# Patient Record
Sex: Female | Born: 1965 | Race: White | Hispanic: No | Marital: Married | Smoking: Never smoker
Health system: Southern US, Community
[De-identification: ages and names within clinical notes are randomized; demographics above are authoritative.]

---

## 1997-10-30 ENCOUNTER — Ambulatory Visit (HOSPITAL_COMMUNITY): Admission: RE | Admit: 1997-10-30 | Discharge: 1997-10-30 | Payer: Self-pay | Admitting: Obstetrics and Gynecology

## 2004-12-15 ENCOUNTER — Other Ambulatory Visit: Admission: RE | Admit: 2004-12-15 | Discharge: 2004-12-15 | Payer: Self-pay | Admitting: Obstetrics and Gynecology

## 2006-03-02 ENCOUNTER — Encounter: Admission: RE | Admit: 2006-03-02 | Discharge: 2006-03-02 | Payer: Self-pay | Admitting: Obstetrics and Gynecology

## 2010-02-06 ENCOUNTER — Encounter: Payer: Self-pay | Admitting: Obstetrics and Gynecology

## 2011-01-26 ENCOUNTER — Other Ambulatory Visit: Payer: Self-pay | Admitting: Obstetrics and Gynecology

## 2011-01-26 DIAGNOSIS — R928 Other abnormal and inconclusive findings on diagnostic imaging of breast: Secondary | ICD-10-CM

## 2011-02-07 ENCOUNTER — Ambulatory Visit
Admission: RE | Admit: 2011-02-07 | Discharge: 2011-02-07 | Disposition: A | Discharge status: Home / Self Care | Payer: BC Managed Care – PPO | Source: Ambulatory Visit | Attending: Obstetrics and Gynecology | Admitting: Obstetrics and Gynecology

## 2011-02-07 DIAGNOSIS — R928 Other abnormal and inconclusive findings on diagnostic imaging of breast: Secondary | ICD-10-CM

## 2012-10-31 ENCOUNTER — Encounter: Payer: Self-pay | Admitting: Internal Medicine

## 2017-09-03 ENCOUNTER — Other Ambulatory Visit: Payer: Self-pay | Admitting: Family Medicine

## 2017-09-03 DIAGNOSIS — Z6827 Body mass index (BMI) 27.0-27.9, adult: Secondary | ICD-10-CM | POA: Diagnosis not present

## 2017-09-03 DIAGNOSIS — M7711 Lateral epicondylitis, right elbow: Secondary | ICD-10-CM | POA: Diagnosis not present

## 2017-09-03 DIAGNOSIS — Z803 Family history of malignant neoplasm of breast: Secondary | ICD-10-CM | POA: Diagnosis not present

## 2017-09-03 DIAGNOSIS — N6310 Unspecified lump in the right breast, unspecified quadrant: Secondary | ICD-10-CM

## 2017-09-07 ENCOUNTER — Ambulatory Visit
Admission: RE | Admit: 2017-09-07 | Discharge: 2017-09-07 | Disposition: A | Discharge status: Home / Self Care | Payer: BLUE CROSS/BLUE SHIELD | Source: Ambulatory Visit | Attending: Family Medicine | Admitting: Family Medicine

## 2017-09-07 DIAGNOSIS — Z13220 Encounter for screening for lipoid disorders: Secondary | ICD-10-CM | POA: Diagnosis not present

## 2017-09-07 DIAGNOSIS — N6310 Unspecified lump in the right breast, unspecified quadrant: Secondary | ICD-10-CM

## 2017-09-07 DIAGNOSIS — Z1329 Encounter for screening for other suspected endocrine disorder: Secondary | ICD-10-CM | POA: Diagnosis not present

## 2017-09-07 DIAGNOSIS — Z0000 Encounter for general adult medical examination without abnormal findings: Secondary | ICD-10-CM | POA: Diagnosis not present

## 2017-09-07 DIAGNOSIS — Z114 Encounter for screening for human immunodeficiency virus [HIV]: Secondary | ICD-10-CM | POA: Diagnosis not present

## 2017-09-07 DIAGNOSIS — R922 Inconclusive mammogram: Secondary | ICD-10-CM | POA: Diagnosis not present

## 2017-09-07 DIAGNOSIS — N6489 Other specified disorders of breast: Secondary | ICD-10-CM | POA: Diagnosis not present

## 2017-09-12 DIAGNOSIS — Z23 Encounter for immunization: Secondary | ICD-10-CM | POA: Diagnosis not present

## 2017-09-12 DIAGNOSIS — Z6827 Body mass index (BMI) 27.0-27.9, adult: Secondary | ICD-10-CM | POA: Diagnosis not present

## 2017-09-12 DIAGNOSIS — Z0000 Encounter for general adult medical examination without abnormal findings: Secondary | ICD-10-CM | POA: Diagnosis not present

## 2017-09-13 ENCOUNTER — Encounter: Payer: Self-pay | Admitting: Gastroenterology

## 2017-10-12 DIAGNOSIS — Z23 Encounter for immunization: Secondary | ICD-10-CM | POA: Diagnosis not present

## 2017-10-26 ENCOUNTER — Telehealth: Payer: Self-pay

## 2017-10-26 NOTE — Telephone Encounter (Signed)
Patient No Showed for PV today. A message was left on voicemail to call and reschedule her appointment by 5:00 Pm today. Patient was informed that her colonoscopy would be cancelled if she does not reschedule by 5:00 Pm today. A no show letter will also be mailed.   Riki Sheer, LPN ( PV )

## 2017-11-09 ENCOUNTER — Encounter: Payer: BLUE CROSS/BLUE SHIELD | Admitting: Gastroenterology

## 2018-02-27 DIAGNOSIS — Z23 Encounter for immunization: Secondary | ICD-10-CM | POA: Diagnosis not present

## 2018-02-27 DIAGNOSIS — L570 Actinic keratosis: Secondary | ICD-10-CM | POA: Diagnosis not present

## 2018-02-27 DIAGNOSIS — L820 Inflamed seborrheic keratosis: Secondary | ICD-10-CM | POA: Diagnosis not present

## 2018-03-15 DIAGNOSIS — Z23 Encounter for immunization: Secondary | ICD-10-CM | POA: Diagnosis not present

## 2018-08-12 DIAGNOSIS — M25561 Pain in right knee: Secondary | ICD-10-CM | POA: Diagnosis not present

## 2018-09-06 DIAGNOSIS — M25561 Pain in right knee: Secondary | ICD-10-CM | POA: Diagnosis not present

## 2018-09-10 DIAGNOSIS — Z20828 Contact with and (suspected) exposure to other viral communicable diseases: Secondary | ICD-10-CM | POA: Diagnosis not present

## 2018-09-18 DIAGNOSIS — M25561 Pain in right knee: Secondary | ICD-10-CM | POA: Diagnosis not present

## 2018-10-10 DIAGNOSIS — Z23 Encounter for immunization: Secondary | ICD-10-CM | POA: Diagnosis not present

## 2019-04-09 ENCOUNTER — Other Ambulatory Visit: Payer: Self-pay | Admitting: Family Medicine

## 2019-04-09 DIAGNOSIS — Z1231 Encounter for screening mammogram for malignant neoplasm of breast: Secondary | ICD-10-CM

## 2019-05-22 ENCOUNTER — Encounter: Payer: Self-pay | Admitting: Gastroenterology

## 2019-05-23 ENCOUNTER — Other Ambulatory Visit: Payer: Self-pay

## 2019-05-23 ENCOUNTER — Ambulatory Visit
Admission: RE | Admit: 2019-05-23 | Discharge: 2019-05-23 | Disposition: A | Discharge status: Home / Self Care | Payer: 59 | Source: Ambulatory Visit | Attending: Family Medicine | Admitting: Family Medicine

## 2019-05-23 DIAGNOSIS — Z1231 Encounter for screening mammogram for malignant neoplasm of breast: Secondary | ICD-10-CM

## 2019-06-23 IMAGING — US US AXILLARY RIGHT
1 series · 2 of 2 positions shown · non-contrast
Comparison: Previous exam(s).

CLINICAL DATA: Patient presents with fullness the right axilla.

EXAM:
DIGITAL DIAGNOSTIC BILATERAL MAMMOGRAM WITH CAD AND TOMO
ULTRASOUND RIGHT AXILLA

[Series 1: us axillary right · 0.07mm/px · 2 of 2 slices shown]
[im 1/2]
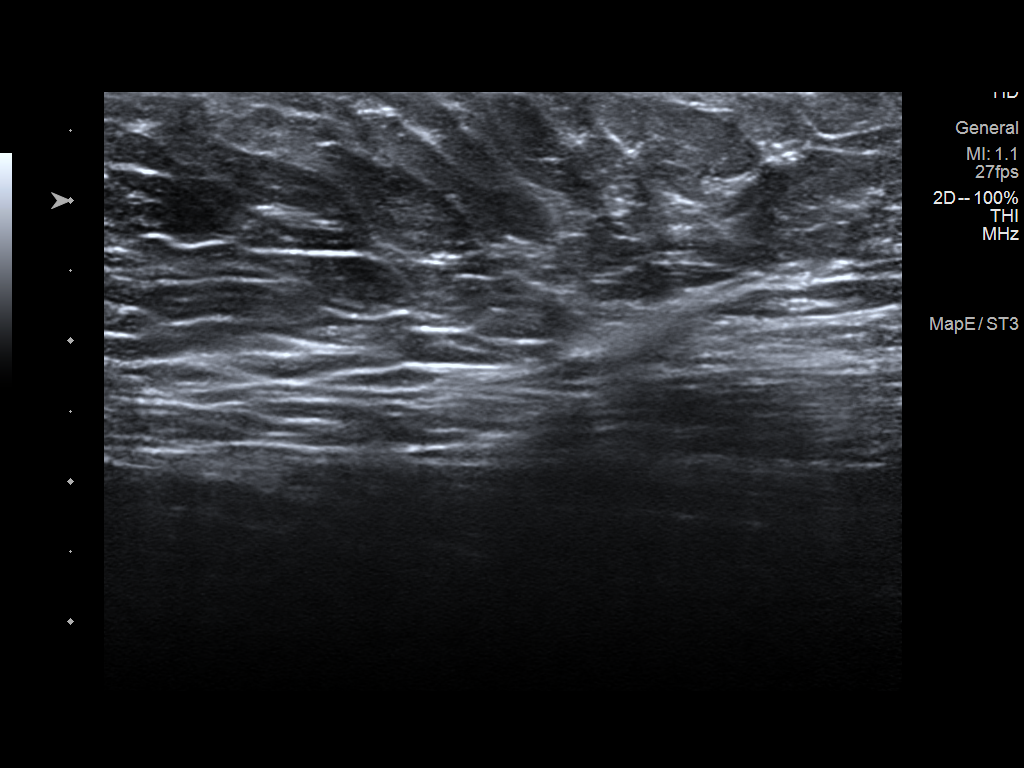
[im 2/2]
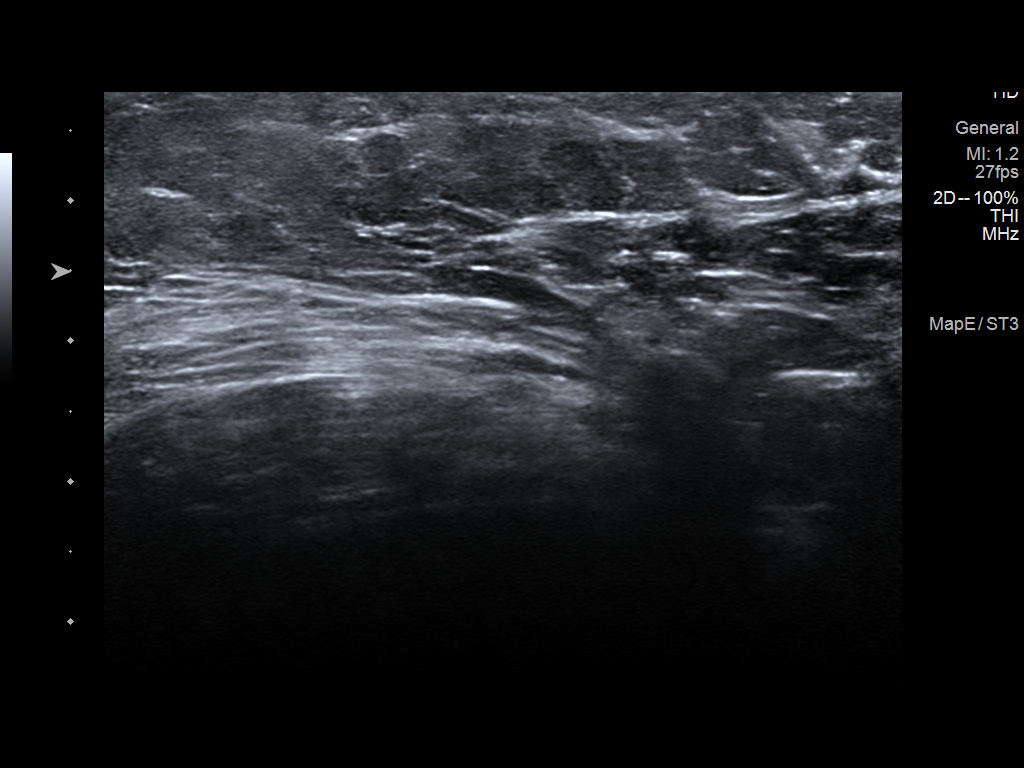

[2 of 2 positions shown; findings below may reference images not displayed]

ACR Breast Density Category c: The breast tissue is heterogeneously
dense, which may obscure small masses.
FINDINGS: There are no discrete masses, areas of architectural distortion
areas of significant asymmetry or suspicious calcifications. No
mammographic change.

Mammographic images were processed with CAD.

On physical exam, there is mild asymmetry the axilla, right
prominent left, but no discrete mass.

Targeted ultrasound is performed, showing normal tissue in the right
axilla. Mass. No enlarged or lymph nodes.
IMPRESSION: 1. Negative exam.  No evidence of breast malignancy.
2. Right axillary fullness appears to be physiologic.

RECOMMENDATION:
Screening mammogram in one year.(Code:TY-G-2YC)

I have discussed the findings and recommendations with the patient.
Results were also provided in writing at the conclusion of the
visit. If applicable, a reminder letter will be sent to the patient
regarding the next appointment.

BI-RADS CATEGORY  1: Negative.

## 2019-06-28 ENCOUNTER — Other Ambulatory Visit: Payer: Self-pay

## 2019-06-28 ENCOUNTER — Encounter (HOSPITAL_COMMUNITY): Payer: Self-pay | Admitting: Anesthesiology

## 2019-06-28 ENCOUNTER — Encounter (HOSPITAL_COMMUNITY)
Admission: EM | Disposition: A | Discharge status: Home / Self Care | Payer: Self-pay | Source: Home / Self Care | Attending: Emergency Medicine

## 2019-06-28 ENCOUNTER — Emergency Department (HOSPITAL_COMMUNITY): Payer: No Typology Code available for payment source | Admitting: Anesthesiology

## 2019-06-28 ENCOUNTER — Emergency Department (HOSPITAL_COMMUNITY): Payer: No Typology Code available for payment source

## 2019-06-28 ENCOUNTER — Ambulatory Visit (HOSPITAL_COMMUNITY)
Admission: EM | Admit: 2019-06-28 | Discharge: 2019-06-29 | Disposition: A | Discharge status: Home / Self Care | Payer: No Typology Code available for payment source | Attending: Emergency Medicine | Admitting: Emergency Medicine

## 2019-06-28 DIAGNOSIS — K353 Acute appendicitis with localized peritonitis, without perforation or gangrene: Secondary | ICD-10-CM | POA: Insufficient documentation

## 2019-06-28 DIAGNOSIS — R1031 Right lower quadrant pain: Secondary | ICD-10-CM | POA: Diagnosis present

## 2019-06-28 DIAGNOSIS — K429 Umbilical hernia without obstruction or gangrene: Secondary | ICD-10-CM | POA: Insufficient documentation

## 2019-06-28 DIAGNOSIS — Z20822 Contact with and (suspected) exposure to covid-19: Secondary | ICD-10-CM | POA: Insufficient documentation

## 2019-06-28 HISTORY — PX: UMBILICAL HERNIA REPAIR: SHX196

## 2019-06-28 HISTORY — PX: LAPAROSCOPIC APPENDECTOMY: SHX408

## 2019-06-28 LAB — COMPREHENSIVE METABOLIC PANEL
ALT: 19 U/L (ref 0–44)
AST: 16 U/L (ref 15–41)
Albumin: 4.1 g/dL (ref 3.5–5.0)
Alkaline Phosphatase: 81 U/L (ref 38–126)
Anion gap: 10 (ref 5–15)
BUN: 11 mg/dL (ref 6–20)
CO2: 26 mmol/L (ref 22–32)
Calcium: 9.5 mg/dL (ref 8.9–10.3)
Chloride: 104 mmol/L (ref 98–111)
Creatinine, Ser: 0.86 mg/dL (ref 0.44–1.00)
GFR calc Af Amer: >60 mL/min (ref >60)
GFR calc non Af Amer: >60 mL/min (ref >60)
Glucose, Bld: 102 mg/dL — ABNORMAL HIGH (ref 70–99)
Potassium: 4 mmol/L (ref 3.5–5.1)
Sodium: 140 mmol/L (ref 135–145)
Total Bilirubin: 1.8 mg/dL — ABNORMAL HIGH (ref 0.3–1.2)
Total Protein: 7.5 g/dL (ref 6.5–8.1)

## 2019-06-28 LAB — URINALYSIS, ROUTINE W REFLEX MICROSCOPIC
Bacteria, UA: NONE SEEN
Bilirubin Urine: NEGATIVE
Glucose, UA: NEGATIVE mg/dL
Ketones, ur: NEGATIVE mg/dL
Leukocytes,Ua: NEGATIVE
Nitrite: NEGATIVE
Protein, ur: NEGATIVE mg/dL
Specific Gravity, Urine: 1.01 (ref 1.005–1.030)
pH: 5 (ref 5.0–8.0)

## 2019-06-28 LAB — CBC
HCT: 39.7 % (ref 36.0–46.0)
Hemoglobin: 12.9 g/dL (ref 12.0–15.0)
MCH: 30.9 pg (ref 26.0–34.0)
MCHC: 32.5 g/dL (ref 30.0–36.0)
MCV: 95 fL (ref 80.0–100.0)
Platelets: 191 10*3/uL (ref 150–400)
RBC: 4.18 MIL/uL (ref 3.87–5.11)
RDW: 14 % (ref 11.5–15.5)
WBC: 14.4 10*3/uL — ABNORMAL HIGH (ref 4.0–10.5)
nRBC: 0 % (ref 0.0–0.2)

## 2019-06-28 LAB — PREGNANCY, URINE: Preg Test, Ur: NEGATIVE

## 2019-06-28 LAB — LIPASE, BLOOD: Lipase: 23 U/L (ref 11–51)

## 2019-06-28 LAB — I-STAT BETA HCG BLOOD, ED (MC, WL, AP ONLY): I-stat hCG, quantitative: 15.3 m[IU]/mL — ABNORMAL HIGH (ref <5)

## 2019-06-28 LAB — SARS CORONAVIRUS 2 BY RT PCR (HOSPITAL ORDER, PERFORMED IN ~~LOC~~ HOSPITAL LAB): SARS Coronavirus 2: NEGATIVE

## 2019-06-28 SURGERY — APPENDECTOMY, LAPAROSCOPIC
Anesthesia: General | Site: Abdomen

## 2019-06-28 MED ORDER — SODIUM CHLORIDE 0.9 % IV BOLUS
1000.0000 mL | Freq: Once | INTRAVENOUS | Status: AC
Start: 2019-06-28 — End: 2019-06-28
  Administered 2019-06-28: 1000 mL via INTRAVENOUS

## 2019-06-28 MED ORDER — KETOROLAC TROMETHAMINE 30 MG/ML IJ SOLN
INTRAMUSCULAR | Status: DC | PRN
  Administered 2019-06-28: 15 mg via INTRAVENOUS

## 2019-06-28 MED ORDER — DEXAMETHASONE SODIUM PHOSPHATE 10 MG/ML IJ SOLN
INTRAMUSCULAR | Status: DC | PRN
  Administered 2019-06-28: 10 mg via INTRAVENOUS

## 2019-06-28 MED ORDER — FENTANYL CITRATE (PF) 100 MCG/2ML IJ SOLN
INTRAMUSCULAR | Status: DC | PRN
  Administered 2019-06-28: 25 ug via INTRAVENOUS
  Administered 2019-06-28 (×2): 50 ug via INTRAVENOUS

## 2019-06-28 MED ORDER — PROPOFOL 10 MG/ML IV BOLUS
INTRAVENOUS | Status: DC | PRN
  Administered 2019-06-28: 170 mg via INTRAVENOUS

## 2019-06-28 MED ORDER — SUCCINYLCHOLINE CHLORIDE 20 MG/ML IJ SOLN
INTRAMUSCULAR | Status: DC | PRN
  Administered 2019-06-28: 140 mg via INTRAVENOUS

## 2019-06-28 MED ORDER — SUGAMMADEX SODIUM 200 MG/2ML IV SOLN
INTRAVENOUS | Status: DC | PRN
  Administered 2019-06-28: 200 mg via INTRAVENOUS

## 2019-06-28 MED ORDER — ONDANSETRON HCL 4 MG/2ML IJ SOLN
4.0000 mg | Freq: Once | INTRAMUSCULAR | Status: AC
  Administered 2019-06-28: 4 mg via INTRAVENOUS
  Filled 2019-06-28: qty 2

## 2019-06-28 MED ORDER — IOHEXOL 300 MG/ML  SOLN
100.0000 mL | Freq: Once | INTRAMUSCULAR | Status: AC | PRN
  Administered 2019-06-28: 100 mL via INTRAVENOUS

## 2019-06-28 MED ORDER — BUPIVACAINE HCL (PF) 0.25 % IJ SOLN
INTRAMUSCULAR | Status: AC
  Filled 2019-06-28: qty 30

## 2019-06-28 MED ORDER — ONDANSETRON HCL 4 MG/2ML IJ SOLN
INTRAMUSCULAR | Status: DC | PRN
  Administered 2019-06-28: 4 mg via INTRAVENOUS

## 2019-06-28 MED ORDER — SODIUM CHLORIDE 0.9% FLUSH: 3.0000 mL | Freq: Once | INTRAVENOUS | Status: DC

## 2019-06-28 MED ORDER — MIDAZOLAM HCL 2 MG/2ML IJ SOLN
INTRAMUSCULAR | Status: AC
  Filled 2019-06-28: qty 2

## 2019-06-28 MED ORDER — MIDAZOLAM HCL 5 MG/5ML IJ SOLN
INTRAMUSCULAR | Status: DC | PRN
  Administered 2019-06-28 (×2): 1 mg via INTRAVENOUS

## 2019-06-28 MED ORDER — LACTATED RINGERS IV SOLN: INTRAVENOUS | Status: DC | PRN

## 2019-06-28 MED ORDER — SODIUM CHLORIDE 0.9 % IV SOLN
2.0000 g | Freq: Once | INTRAVENOUS | Status: AC
  Administered 2019-06-28: 2 g via INTRAVENOUS
  Filled 2019-06-28: qty 20

## 2019-06-28 MED ORDER — ROCURONIUM BROMIDE 10 MG/ML (PF) SYRINGE
PREFILLED_SYRINGE | INTRAVENOUS | Status: DC | PRN
  Administered 2019-06-28: 30 mg via INTRAVENOUS
  Administered 2019-06-28: 10 mg via INTRAVENOUS

## 2019-06-28 MED ORDER — METRONIDAZOLE IN NACL 5-0.79 MG/ML-% IV SOLN
500.0000 mg | Freq: Once | INTRAVENOUS | Status: AC
  Administered 2019-06-28: 500 mg via INTRAVENOUS
  Filled 2019-06-28: qty 100

## 2019-06-28 MED ORDER — FENTANYL CITRATE (PF) 250 MCG/5ML IJ SOLN
INTRAMUSCULAR | Status: AC
  Filled 2019-06-28: qty 5

## 2019-06-28 MED ORDER — LIDOCAINE 2% (20 MG/ML) 5 ML SYRINGE
INTRAMUSCULAR | Status: DC | PRN
  Administered 2019-06-28: 40 mg via INTRAVENOUS

## 2019-06-28 SURGICAL SUPPLY — 48 items
APPLIER CLIP ROT 10 11.4 M/L (STAPLE)
CANISTER SUCT 3000ML PPV (MISCELLANEOUS) ×3 IMPLANT
CHLORAPREP W/TINT 26 (MISCELLANEOUS) ×3 IMPLANT
CLIP APPLIE ROT 10 11.4 M/L (STAPLE) IMPLANT
CLOSURE STERI-STRIP 1/2X4 (GAUZE/BANDAGES/DRESSINGS) ×1
CLOSURE WOUND 1/2 X4 (GAUZE/BANDAGES/DRESSINGS) ×1
CLSR STERI-STRIP ANTIMIC 1/2X4 (GAUZE/BANDAGES/DRESSINGS) ×2 IMPLANT
COVER SURGICAL LIGHT HANDLE (MISCELLANEOUS) ×3 IMPLANT
COVER WAND RF STERILE (DRAPES) ×3 IMPLANT
CUTTER FLEX LINEAR 45M (STAPLE) ×3 IMPLANT
DERMABOND ADVANCED (GAUZE/BANDAGES/DRESSINGS) ×2
DERMABOND ADVANCED .7 DNX12 (GAUZE/BANDAGES/DRESSINGS) ×1 IMPLANT
ELECT REM PT RETURN 9FT ADLT (ELECTROSURGICAL) ×3
ELECTRODE REM PT RTRN 9FT ADLT (ELECTROSURGICAL) ×1 IMPLANT
GLOVE BIO SURGEON STRL SZ7 (GLOVE) ×3 IMPLANT
GLOVE BIOGEL PI IND STRL 7.5 (GLOVE) ×1 IMPLANT
GLOVE BIOGEL PI INDICATOR 7.5 (GLOVE) ×2
GLOVE INDICATOR 7.0 STRL GRN (GLOVE) ×3 IMPLANT
GLOVE SURG SS PI 7.0 STRL IVOR (GLOVE) ×3 IMPLANT
GOWN STRL REUS W/ TWL LRG LVL3 (GOWN DISPOSABLE) ×3 IMPLANT
GOWN STRL REUS W/TWL LRG LVL3 (GOWN DISPOSABLE) ×9
GRASPER SUT TROCAR 14GX15 (MISCELLANEOUS) ×3 IMPLANT
KIT BASIN OR (CUSTOM PROCEDURE TRAY) ×3 IMPLANT
KIT TURNOVER KIT B (KITS) ×3 IMPLANT
NS IRRIG 1000ML POUR BTL (IV SOLUTION) ×3 IMPLANT
PAD ARMBOARD 7.5X6 YLW CONV (MISCELLANEOUS) ×6 IMPLANT
PENCIL BUTTON HOLSTER BLD 10FT (ELECTRODE) ×3 IMPLANT
POUCH RETRIEVAL ECOSAC 10 (ENDOMECHANICALS) ×1 IMPLANT
POUCH RETRIEVAL ECOSAC 10MM (ENDOMECHANICALS) ×3
RELOAD 45 VASCULAR/THIN (ENDOMECHANICALS) IMPLANT
RELOAD STAPLE TA45 3.5 REG BLU (ENDOMECHANICALS) ×3 IMPLANT
SCISSORS LAP 5X35 DISP (ENDOMECHANICALS) IMPLANT
SET IRRIG TUBING LAPAROSCOPIC (IRRIGATION / IRRIGATOR) ×3 IMPLANT
SET TUBE SMOKE EVAC HIGH FLOW (TUBING) ×3 IMPLANT
SHEARS HARMONIC ACE PLUS 36CM (ENDOMECHANICALS) ×3 IMPLANT
SLEEVE ENDOPATH XCEL 5M (ENDOMECHANICALS) ×3 IMPLANT
SPECIMEN JAR SMALL (MISCELLANEOUS) ×3 IMPLANT
STRIP CLOSURE SKIN 1/2X4 (GAUZE/BANDAGES/DRESSINGS) ×2 IMPLANT
SUT MNCRL AB 4-0 PS2 18 (SUTURE) ×3 IMPLANT
SUT NOVA NAB DX-16 0-1 5-0 T12 (SUTURE) ×3 IMPLANT
SUT VICRYL 0 UR6 27IN ABS (SUTURE) ×3 IMPLANT
TOWEL GREEN STERILE (TOWEL DISPOSABLE) ×3 IMPLANT
TOWEL GREEN STERILE FF (TOWEL DISPOSABLE) ×3 IMPLANT
TRAY FOLEY MTR SLVR 16FR STAT (SET/KITS/TRAYS/PACK) IMPLANT
TRAY LAPAROSCOPIC MC (CUSTOM PROCEDURE TRAY) ×3 IMPLANT
TROCAR XCEL BLUNT TIP 100MML (ENDOMECHANICALS) ×3 IMPLANT
TROCAR XCEL NON-BLD 5MMX100MML (ENDOMECHANICALS) ×3 IMPLANT
WATER STERILE IRR 1000ML POUR (IV SOLUTION) ×3 IMPLANT

## 2019-06-28 NOTE — ED Provider Notes (Signed)
[facility] DEPART[facility] Provider Note   CSN: [id number] Arrival date & time: 06/28/19  1432     History Chief Complaint  Patient presents with  . Abdominal Pain    Wanda [name] is a 54 y.o. female presents for evaluation of acute onset, progressively worsening right lower quadrant abdominal pain since last night.  She reports that symptoms began after eating "bad [place] food".  She reports that her husband felt a little unwell after eating the [place] food but has not had any symptoms similar to her otherwise and her daughter ate the same food and has had no symptoms at all.  She reports severe sharp right lower quadrant abdominal pain that was initially more generalized.  The pain worsens with certain movements, standing upright.  She has had nausea and one episode of nonbloody nonbilious emesis.  Feels as though her abdomen is distended.  She had a bowel movement earlier today that was a little bit constipated but no diarrhea, melena, hematochezia, urinary symptoms, vaginal itching, bleeding, or discharge out of the ordinary.  No fevers or chills.  She has tried naproxen, Gas-X, Tums, and Imodium without relief of symptoms.  She has had a C-section but otherwise no prior abdominal surgeries.  She is 2 years postmenopausal.  The history is provided by the patient.       History reviewed. No pertinent past medical history.  There are no problems to display for this patient.   History reviewed. No pertinent surgical history.   OB History   No obstetric history on file.     Family History  Problem Relation Age of Onset  . Breast cancer Sister     Social History   Tobacco Use  . Smoking status: Not on file  Substance Use Topics  . Alcohol use: Not on file  . Drug use: Not on file    Home Medications Prior to Admission medications   Not on File    Allergies    Patient has no known allergies.  Review of Systems   Review of Systems   Constitutional: Negative for chills and fever.  Respiratory: Negative for shortness of breath.   Cardiovascular: Negative for chest pain.  Gastrointestinal: Positive for abdominal pain, constipation, nausea and vomiting. Negative for diarrhea.  Genitourinary: Negative for dysuria, frequency, hematuria, urgency, vaginal bleeding, vaginal discharge and vaginal pain.  All other systems reviewed and are negative.   Physical Exam Updated Vital Signs BP 105/74 (BP Location: Right Arm)   Pulse 89   Temp 98.7 F (37.1 C) (Oral)   Resp 18   Ht 5\' 6"  (1.676 m)   Wt 72.6 kg   SpO2 97%   BMI 25.82 kg/m   Physical Exam Vitals and nursing note reviewed.  Constitutional:      General: She is not in acute distress.    Appearance: She is well-developed.  HENT:     Head: Normocephalic and atraumatic.  Eyes:     General:        Right eye: No discharge.        Left eye: No discharge.     Conjunctiva/sclera: Conjunctivae normal.  Neck:     Vascular: No JVD.     Trachea: No tracheal deviation.  Cardiovascular:     Rate and Rhythm: Normal rate.  Pulmonary:     Effort: Pulmonary effort is normal.  Abdominal:     General: Abdomen is flat. Bowel sounds are normal. There is no distension.  Palpations: Abdomen is soft.     Tenderness: There is abdominal tenderness in the right lower quadrant and suprapubic area. There is guarding. There is no right CVA tenderness or left CVA tenderness. Positive signs include McBurney's sign and psoas sign. Negative signs include Murphy's sign, Rovsing's sign and obturator sign.  Skin:    General: Skin is warm.     Findings: No erythema.  Neurological:     Mental Status: She is alert.  Psychiatric:        Behavior: Behavior normal.     ED Results / Procedures / Treatments   Labs (all labs ordered are listed, but only abnormal results are displayed) Labs Reviewed  COMPREHENSIVE METABOLIC PANEL - Abnormal; Notable for the following components:       Result Value   Glucose, Bld 102 (*)    Total Bilirubin 1.8 (*)    All other components within normal limits  CBC - Abnormal; Notable for the following components:   WBC 14.4 (*)    All other components within normal limits  URINALYSIS, ROUTINE W REFLEX MICROSCOPIC - Abnormal; Notable for the following components:   Hgb urine dipstick SMALL (*)    All other components within normal limits  I-STAT BETA HCG BLOOD, ED ([facility], [facility], [facility] ONLY) - Abnormal; Notable for the following components:   I-stat hCG, quantitative 15.3 (*)    All other components within normal limits  SARS CORONAVIRUS 2 BY RT PCR (HOSPITAL ORDER, [facility])  LIPASE, BLOOD  PREGNANCY, URINE    EKG None  Radiology CT ABDOMEN PELVIS W CONTRAST  Result Date: 06/28/2019 CLINICAL DATA:  Right lower quadrant abdominal pain. EXAM: CT ABDOMEN AND PELVIS WITH CONTRAST TECHNIQUE: Multidetector CT imaging of the abdomen and pelvis was performed using the standard protocol following bolus administration of intravenous contrast. CONTRAST:  113mL OMNIPAQUE IOHEXOL 300 MG/ML  SOLN COMPARISON:  None. FINDINGS: Lower chest: The lung bases are clear. The heart size is normal. Hepatobiliary: The liver is normal. Normal gallbladder.There is no biliary ductal dilation. Pancreas: Normal contours without ductal dilatation. No peripancreatic fluid collection. Spleen: Unremarkable. Adrenals/Urinary Tract: --Adrenal glands: Unremarkable. --Right kidney/ureter: A punctate nonobstructing stone is noted in the lower pole. --Left kidney/ureter: There is a punctate nonobstructing stone in the interpolar region of the left kidney. 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 --Urinary bladder: The urinary bladder is significantly distended. Stomach/Bowel: --Stomach/Duodenum: No hiatal hernia or other gastric abnormality. Normal duodenal course and caliber. --Small bowel: Unremarkable. --Colon: Unremarkable. --Appendix: The  appendix is dilated with significant periappendiceal inflammatory changes. The appendix measures up to approximately 1.3 cm in diameter. There is a small amount of reactive free fluid in the patient's pelvis and right lower quadrant. There is no extraluminal free air. There is no well-formed fluid collection concerning for an abscess. Vascular/Lymphatic: Normal course and caliber of the major abdominal vessels. --No retroperitoneal lymphadenopathy. --No mesenteric lymphadenopathy. --No pelvic or inguinal lymphadenopathy. Reproductive: Unremarkable Other: There is a small amount of reactive free fluid in the patient's abdomen and pelvis. There is a fat containing umbilical hernia. Musculoskeletal. No acute displaced fractures. IMPRESSION: 1. Acute appendicitis without evidence for perforation or abscess formation. 2. Distended urinary bladder. 3. Fat containing umbilical hernia. 4. Bilateral nonobstructive nephrolithiasis. Electronically Signed   By: Constance Holster M.D.   On: 06/28/2019 20:19    Procedures Procedures (including critical care time)  Medications Ordered in ED Medications  sodium chloride flush (NS) 0.9 % injection 3  mL (has no administration in time range)  sodium chloride 0.9 % bolus 1,000 mL (0 mLs Intravenous Stopped 06/28/19 2038)  ondansetron (ZOFRAN) injection 4 mg (4 mg Intravenous Given 06/28/19 1920)  iohexol (OMNIPAQUE) 300 MG/ML solution 100 mL (100 mLs Intravenous Contrast Given 06/28/19 2002)  cefTRIAXone (ROCEPHIN) 2 g in sodium chloride 0.9 % 100 mL IVPB (0 g Intravenous Stopped 06/28/19 2128)    And  metroNIDAZOLE (FLAGYL) IVPB 500 mg (0 mg Intravenous Stopped 06/28/19 2142)    ED Course  I have reviewed the triage vital signs and the nursing notes.  Pertinent labs & imaging results that were available during my care of the patient were reviewed by me and considered in my medical decision making (see chart for details).    MDM Rules/Calculators/A&P                           Patient presenting for evaluation of progressively worsening right lower quadrant abdominal pain with associated nausea and vomiting.  She is afebrile, initially tachycardic and mildly hypotensive but had improvement in vital signs on reevaluation and with administration of IV fluids.  She exhibits right lower quadrant abdominal pain with guarding and localized peritoneal signs.  Point-of-care hCG mildly elevated though she is 2 years postmenopausal and I suspect this is a false positive.  She has no GU complaints at this time and I doubt ectopic pregnancy, TOA or ovarian torsion.  Urine pregnancy is negative.  Remainder of lab work reviewed and interpreted by myself shows a leukocytosis of 14.4, no anemia, no metabolic derangements, no renal insufficiency.  CT scan confirms acute appendicitis without evidence of abscess or perforation.  Will start on IV Rocephin and Flagyl.  Patient has refused pain medicine while in the ED which she received IV fluids and Zofran with some improvement in her symptoms.  CONSULT: Spoke with Dr. Donne Hazel with general surgery, plan for laparoscopic appendectomy after Covid test results.   Final Clinical Impression(s) / ED Diagnoses Final diagnoses:  Acute appendicitis with localized peritonitis, without perforation, abscess, or gangrene    Rx / DC Orders ED Discharge Orders    None       Renita Papa, PA-C 06/28/19 2340    Maudie Flakes, MD 06/29/19 2326

## 2019-06-28 NOTE — Anesthesia Preprocedure Evaluation (Addendum)
Anesthesia Evaluation  Patient identified by MRN, date of birth, ID band Patient awake    Reviewed: Allergy & Precautions, NPO status , Patient's Chart, lab work & pertinent test results  Airway Mallampati: I  TM Distance: >3 FB Neck ROM: Full    Dental  (+) Teeth Intact, Dental Advisory Given Multiple caps and crowns. Dental advisory given. Temporary crown bottom back left molar:   Pulmonary    breath sounds clear to auscultation       Cardiovascular  Rhythm:Regular Rate:Normal     Neuro/Psych    GI/Hepatic   Endo/Other    Renal/GU      Musculoskeletal   Abdominal   Peds  Hematology   Anesthesia Other Findings Patient denies any chance of pregnancy  Reproductive/Obstetrics                         Anesthesia Physical Anesthesia Plan  ASA: II and emergent  Anesthesia Plan: General   Post-op Pain Management:    Induction: Intravenous, Cricoid pressure planned and Rapid sequence  PONV Risk Score and Plan: Ondansetron and Dexamethasone  Airway Management Planned: Oral ETT  Additional Equipment:   Intra-op Plan:   Post-operative Plan: Extubation in OR  Informed Consent: I have reviewed the patients History and Physical, chart, labs and discussed the procedure including the risks, benefits and alternatives for the proposed anesthesia with the patient or authorized representative who has indicated his/her understanding and acceptance.     Dental advisory given  Plan Discussed with: CRNA and Anesthesiologist  Anesthesia Plan Comments:         Anesthesia Quick Evaluation

## 2019-06-28 NOTE — Anesthesia Procedure Notes (Signed)
Procedure Name: Intubation Date/Time: 06/28/2019 10:45 PM Performed by: Suzy Bouchard, CRNA Pre-anesthesia Checklist: Patient identified, Emergency Drugs available, Suction available, Patient being monitored and Timeout performed Patient Re-evaluated:Patient Re-evaluated prior to induction Oxygen Delivery Method: Circle system utilized Preoxygenation: Pre-oxygenation with 100% oxygen Induction Type: IV induction and Rapid sequence Laryngoscope Size: Miller and 2 Grade View: Grade I Tube type: Oral Tube size: 7.0 mm Number of attempts: 1 Airway Equipment and Method: Stylet Secured at: 22 cm Tube secured with: Tape Dental Injury: Teeth and Oropharynx as per pre-operative assessment

## 2019-06-28 NOTE — ED Triage Notes (Signed)
Patient complains of generalized abdominal pain now more located rlq. Reports 1 episode of emesis. States that she thinks related to eating chinese food. Pain worse with ambulation and bending. Denies urinary symptoms. Alert and oriented

## 2019-06-28 NOTE — H&P (Signed)
Wanda [name] is an 54 y.o. female.   Chief Complaint: ab pain HPI: 45 yof who ate chinese food last night and thought had food poisoning as she developed rlq ab pain that has been worsening. Nothing making it better. No urinary symptoms. No fevers. Some nausea and one episode of emesis.  Only abd surgery is prior c section. She was seen in er and noted to have elevated wbc.  She has ct c/w appendicitis. I was asked to see her.  No history colonoscopy. Has received covid vaccine in march.   No past medical history on file.  No past surgical history on file.  Family History  Problem Relation Age of Onset  . Breast cancer Sister    Social History:  has no history on file for tobacco use, alcohol use, and drug use.  Allergies: No Known Allergies  meds none  Results for orders placed or performed during the hospital encounter of 06/28/19 (from the past 48 hour(s))  Lipase, blood     Status: None   Collection Time: 06/28/19  3:05 PM  Result Value Ref Range   Lipase 23 11 - 51 U/L    Comment: Performed at [facility], [place] [address], [place], [place] [address]  Comprehensive metabolic panel     Status: Abnormal   Collection Time: 06/28/19  3:05 PM  Result Value Ref Range   Sodium 140 135 - 145 mmol/L   Potassium 4.0 3.5 - 5.1 mmol/L   Chloride 104 98 - 111 mmol/L   CO2 26 22 - 32 mmol/L   Glucose, Bld 102 (H) 70 - 99 mg/dL    Comment: Glucose reference range applies only to samples taken after fasting for at least 8 hours.   BUN 11 6 - 20 mg/dL   Creatinine, Ser 0.86 0.44 - 1.00 mg/dL   Calcium 9.5 8.9 - 10.3 mg/dL   Total Protein 7.5 6.5 - 8.1 g/dL   Albumin 4.1 3.5 - 5.0 g/dL   AST 16 15 - 41 U/L   ALT 19 0 - 44 U/L   Alkaline Phosphatase 81 38 - 126 U/L   Total Bilirubin 1.8 (H) 0.3 - 1.2 mg/dL   GFR calc non Af Amer >60 >60 mL/min   GFR calc Af Amer >60 >60 mL/min   Anion gap 10 5 - 15    Comment: Performed at [facility], [place] [address] [place],  [place] [address]  CBC     Status: Abnormal   Collection Time: 06/28/19  3:05 PM  Result Value Ref Range   WBC 14.4 (H) 4.0 - 10.5 K/uL   RBC 4.18 3.87 - 5.11 MIL/uL   Hemoglobin 12.9 12.0 - 15.0 g/dL   HCT 39.7 36 - 46 %   MCV 95.0 80.0 - 100.0 fL   MCH 30.9 26.0 - 34.0 pg   MCHC 32.5 30.0 - 36.0 g/dL   RDW 14.0 11.5 - 15.5 %   Platelets 191 150 - 400 K/uL   nRBC 0.0 0.0 - 0.2 %    Comment: Performed at [facility], [place] [address] [place], [place] [address]  Urinalysis, Routine w reflex microscopic     Status: Abnormal   Collection Time: 06/28/19  3:23 PM  Result Value Ref Range   Color, Urine YELLOW YELLOW   APPearance CLEAR CLEAR   Specific Gravity, Urine 1.010 1.005 - 1.030   pH 5.0 5.0 - 8.0   Glucose, UA NEGATIVE NEGATIVE mg/dL  Hgb urine dipstick SMALL (A) NEGATIVE   Bilirubin Urine NEGATIVE NEGATIVE   Ketones, ur NEGATIVE NEGATIVE mg/dL   Protein, ur NEGATIVE NEGATIVE mg/dL   Nitrite NEGATIVE NEGATIVE   Leukocytes,Ua NEGATIVE NEGATIVE   RBC / HPF 0-5 0 - 5 RBC/hpf   WBC, UA 0-5 0 - 5 WBC/hpf   Bacteria, UA NONE SEEN NONE SEEN   Squamous Epithelial / LPF 0-5 0 - 5   Mucus PRESENT     Comment: Performed at [facility], [place] [address], [place], [place] [address]  I-Stat beta hCG blood, ED     Status: Abnormal   Collection Time: 06/28/19  3:45 PM  Result Value Ref Range   I-stat hCG, quantitative 15.3 (H) <5 mIU/mL   Comment 3            Comment:   GEST. AGE      CONC.  (mIU/mL)   <=1 WEEK        5 - 50     2 WEEKS       50 - 500     3 WEEKS       100 - 10,000     4 WEEKS     1,000 - 30,000        FEMALE AND NON-PREGNANT FEMALE:     LESS THAN 5 mIU/mL   Pregnancy, urine     Status: None   Collection Time: 06/28/19  6:09 PM  Result Value Ref Range   Preg Test, Ur NEGATIVE NEGATIVE    Comment:        THE SENSITIVITY OF THIS METHODOLOGY IS >20 mIU/mL. Performed at [facility], [place] [address], [place],  [address]    CT ABDOMEN PELVIS W  CONTRAST  Result Date: 06/28/2019 CLINICAL DATA:  Right lower quadrant abdominal pain. EXAM: CT ABDOMEN AND PELVIS WITH CONTRAST TECHNIQUE: Multidetector CT imaging of the abdomen and pelvis was performed using the standard protocol following bolus administration of intravenous contrast. CONTRAST:  161mL OMNIPAQUE IOHEXOL 300 MG/ML  SOLN COMPARISON:  None. FINDINGS: Lower chest: The lung bases are clear. The heart size is normal. Hepatobiliary: The liver is normal. Normal gallbladder.There is no biliary ductal dilation. Pancreas: Normal contours without ductal dilatation. No peripancreatic fluid collection. Spleen: Unremarkable. Adrenals/Urinary Tract: --Adrenal glands: Unremarkable. --Right kidney/ureter: A punctate nonobstructing stone is noted in the lower pole. --Left kidney/ureter: There is a punctate nonobstructing stone in the interpolar region of the left kidney. 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 --Urinary bladder: The urinary bladder is significantly distended. Stomach/Bowel: --Stomach/Duodenum: No hiatal hernia or other gastric abnormality. Normal duodenal course and caliber. --Small bowel: Unremarkable. --Colon: Unremarkable. --Appendix: The appendix is dilated with significant periappendiceal inflammatory changes. The appendix measures up to approximately 1.3 cm in diameter. There is a small amount of reactive free fluid in the patient's pelvis and right lower quadrant. There is no extraluminal free air. There is no well-formed fluid collection concerning for an abscess. Vascular/Lymphatic: Normal course and caliber of the major abdominal vessels. --No retroperitoneal lymphadenopathy. --No mesenteric lymphadenopathy. --No pelvic or inguinal lymphadenopathy. Reproductive: Unremarkable Other: There is a small amount of reactive free fluid in the patient's abdomen and pelvis. There is a fat containing umbilical hernia. Musculoskeletal. No acute displaced fractures. IMPRESSION: 1.  Acute appendicitis without evidence for perforation or abscess formation. 2. Distended urinary bladder. 3. Fat containing umbilical hernia. 4. Bilateral nonobstructive nephrolithiasis. Electronically Signed   By: Jamie Kato.D.  On: 06/28/2019 20:19    Review of Systems  Gastrointestinal: Positive for abdominal pain, nausea and vomiting.  All other systems reviewed and are negative.   Blood pressure 105/74, pulse 89, temperature 98.7 F (37.1 C), temperature source Oral, resp. rate 18, height 5\' 6"  (1.676 m), weight 72.6 kg, SpO2 97 %. Physical Exam  Constitutional: She appears well-developed.  Eyes: No scleral icterus.  Respiratory: Effort normal.  GI: Soft. She exhibits no distension. There is no hepatomegaly. There is abdominal tenderness in the right lower quadrant and suprapubic area. A hernia is present. Hernia confirmed positive in the umbilical area.  Neurological: She is alert.  Skin: Skin is warm and dry. No rash noted.  Psychiatric: Her behavior is normal. Mood normal.     Assessment/Plan Appendicitis -discussed pathophysiology of appendicitis -recommended proceeding to OR for lap appendectomy tonight -possibly dc tonight or in am  -discussed risks/recovery   Rolm Bookbinder, MD 06/28/2019, 9:34 PM

## 2019-06-29 MED ORDER — ONDANSETRON HCL 4 MG/2ML IJ SOLN: 4.0000 mg | Freq: Once | INTRAMUSCULAR | Status: DC | PRN

## 2019-06-29 MED ORDER — 0.9 % SODIUM CHLORIDE (POUR BTL) OPTIME
TOPICAL | Status: DC | PRN
  Administered 2019-06-28: 1000 mL

## 2019-06-29 MED ORDER — OXYCODONE HCL 5 MG PO TABS: 5.0000 mg | ORAL_TABLET | Freq: Four times a day (QID) | ORAL | 0 refills | Status: AC | PRN

## 2019-06-29 MED ORDER — OXYCODONE HCL 5 MG PO TABS
5.0000 mg | ORAL_TABLET | ORAL | Status: DC | PRN
  Administered 2019-06-29: 5 mg via ORAL

## 2019-06-29 MED ORDER — OXYCODONE HCL 5 MG PO TABS
ORAL_TABLET | ORAL | Status: AC
  Filled 2019-06-29: qty 1

## 2019-06-29 MED ORDER — BUPIVACAINE HCL (PF) 0.25 % IJ SOLN
INTRAMUSCULAR | Status: DC | PRN
  Administered 2019-06-28: 8.5 mL

## 2019-06-29 MED ORDER — FENTANYL CITRATE (PF) 100 MCG/2ML IJ SOLN: 25.0000 ug | INTRAMUSCULAR | Status: DC | PRN

## 2019-06-29 NOTE — Anesthesia Postprocedure Evaluation (Signed)
Anesthesia Post Note  Patient: Rima Blizzard  Procedure(s) Performed: APPENDECTOMY LAPAROSCOPIC (N/A Abdomen) HERNIA REPAIR UMBILICAL ADULT (N/A Abdomen)     Patient location during evaluation: PACU Anesthesia Type: General Level of consciousness: awake and alert Pain management: pain level controlled Vital Signs Assessment: post-procedure vital signs reviewed and stable Respiratory status: spontaneous breathing, nonlabored ventilation, respiratory function stable and patient connected to nasal cannula oxygen Cardiovascular status: blood pressure returned to baseline and stable Postop Assessment: no apparent nausea or vomiting Anesthetic complications: no   No complications documented.  Last Vitals:  Vitals:   06/29/19 0020 06/29/19 0033  BP: 124/68 104/72  Pulse: 67 71  Resp: 15 20  Temp:  (!) 36.4 C  SpO2: 97% 95%    Last Pain:  Vitals:   06/29/19 0033  TempSrc:   PainSc: 3                  Shyloh Derosa COKER

## 2019-06-29 NOTE — Transfer of Care (Signed)
Immediate Anesthesia Transfer of Care Note  Patient: Wanda [name]  Procedure(s) Performed: APPENDECTOMY LAPAROSCOPIC (N/A Abdomen) HERNIA REPAIR UMBILICAL ADULT (N/A Abdomen)  Patient Location: PACU  Anesthesia Type:General  Level of Consciousness: awake and alert   Airway & Oxygen Therapy: Patient Spontanous Breathing and Patient connected to nasal cannula oxygen  Post-op Assessment: Report given to RN and Post -op Vital signs reviewed and stable  Post vital signs: Reviewed and stable  Last Vitals:  Vitals Value Taken Time  BP 131/82 06/29/19 0005  Temp    Pulse 75 06/29/19 0008  Resp 16 06/29/19 0008  SpO2 100 % 06/29/19 0008  Vitals shown include unvalidated device data.  Last Pain:  Vitals:   06/28/19 1948  TempSrc: Oral  PainSc:          Complications: No complications documented.

## 2019-06-29 NOTE — Discharge Instructions (Signed)
[facility] LAPAROSCOPIC SURGERY: POST OP INSTRUCTIONS  Always review your discharge instruction sheet given to you by the facility where your surgery was performed. IF YOU HAVE DISABILITY OR FAMILY LEAVE FORMS, YOU MUST BRING THEM TO THE OFFICE FOR PROCESSING.   DO NOT GIVE THEM TO YOUR DOCTOR.  1. A prescription for pain medication may be given to you upon discharge.  Take your pain medication as prescribed, if needed.  If narcotic pain medicine is not needed, then you may take acetaminophen (Tylenol), naprosyn (Alleve), or ibuprofen (Advil) daily for 72 hours and then as needed.  Use ice on your umbilical incision several times per day for next 72 hours. 2. Take your usually prescribed medications unless otherwise directed. 3. If you need a refill on your pain medication, please contact your pharmacy.  They will contact our office to request authorization. Prescriptions will not be filled after 5pm or on week-ends. 4. You should follow a light diet the first few days after arrival home advancing as tolerated.  Be sure to include lots of fluids daily. 5. Most patients will experience some swelling and bruising in the area of the incisions.  Swelling and bruising can take several days to resolve.  6. It is common to experience some constipation if taking pain medication after surgery.  Increasing fluid intake and taking a stool softener (such as Colace) will usually help or prevent this problem from occurring.  A mild laxative (Milk of Magnesia or Miralax) should be taken according to package instructions if there are no bowel movements after 48 hours. 7. Unless discharge instructions indicate otherwise, you may remove your bandages 48 hours after surgery, and you may shower at that time.  You may have steri-strips (small skin tapes) in place directly over the incision.  These strips should be left on the skin for 7-10 days.  If your surgeon used  skin glue on the incision, you may shower in 24 hours.  The glue will flake off over the next 2-3 weeks.  Any sutures or staples will be removed at the office during your follow-up visit. 8. ACTIVITIES:  You may resume regular (light) daily activities beginning the next day--such as daily self-care, walking, climbing stairs--gradually increasing activities as tolerated.  You may have sexual intercourse when it is comfortable.  Refrain from any heavy lifting or straining until approved by your doctor. Do not lift over 10 pounds until seen for postop appointment.  a. You may drive when you are no longer taking prescription pain medication, you can comfortably wear a seatbelt, and you can safely maneuver your car and apply brakes. b. RETURN TO WORK:  __________________________________________________________ 9. You should see your doctor in the office for a follow-up appointment approximately 2-3 weeks after your surgery.  Make sure that you call for this appointment within a day or two after you arrive home to insure a convenient appointment time. 10. OTHER INSTRUCTIONS: __________________________________________________________________________________________________________________________ __________________________________________________________________________________________________________________________ WHEN TO CALL YOUR DOCTOR: 1. Fever over 101.0 2. Inability to urinate 3. Continued bleeding from incision. 4. Increased pain, redness, or drainage from the incision. 5. Increasing abdominal pain  The clinic staff is available to answer your questions during regular business hours.  Please don't hesitate to call and ask to speak to one of the nurses for clinical concerns.  If you have a medical emergency, go to the nearest emergency room or call 911.  A surgeon from [facility] is always on call at the hospital. [address] [place], [place], [place]  [address] ? [address], [place], [place]   [address] [phone] ? [phone] ? FAX [phone] Web site: [email or web address]

## 2019-06-29 NOTE — Op Note (Signed)
Preoperative diagnosis: Acute appendicitis, primary umbilical hernia Postoperative diagnosis: Same as above Procedure: 1.  Laparoscopic appendectomy 2.  Primary umbilical hernia repair Surgeon: Dr. Serita Grammes Anesthesia: General Estimated blood loss: Minimal Specimens: Appendix to pathology Complications: None Drains: None Sponge needle count was correct at completion Disposition to recovery in stable condition  Indications: This a 54 year old female who is otherwise healthy presents with 24 hours of right lower quadrant pain.  She has an elevated white blood cell count and a exam and CT is consistent with acute appendicitis.  I discussed going to the operating room for laparoscopic appendectomy.  She also has a small umbilical hernia on her exam as well as on her CT scan that we discussed repairing as this was at the age plan trocar site.  Procedure: After informed consent was obtained the patient was first given antibiotics.  She was taken to the operating room.  She had SCDs in place.  She was placed under general anesthesia without complication.  She was prepped and draped in the standard sterile surgical fashion.  A surgical timeout was then performed.  I infiltrated Marcaine and made a curvilinear incision below her umbilicus.  I then dissected down to the umbilical stalk.  I then divided this.  She had some excess umbilical skin that I removed a small amount of this.  I then was able to identify what was about a 6 mm umbilical hernia.  I dissected down into the peritoneum bluntly.  I then placed a Hassan trocar through the hernia.  The abdomen was then insufflated to 15 mmHg pressure.  I placed 2 additional 5 mm trochars in the suprapubic region and left lower quadrant under direct vision.  I then was able to identify her cecum, small bowel, and her appendix.  She had acute appendicitis.  This was suppurative but not perforated.  The appendix was very adherent to her terminal ileum.  I  broke this up with blunt dissection.  I then divided the appendiceal mesentery with the harmonic scalpel.  I dissected this back to the base.  The base was viable.  I then used a GIA stapler to divide the appendix at the base of the cecum.  This was then placed in a retrieval bag and removed from the umbilicus.  I then viewed the staple line.  This was hemostatic.  This was clearly on the cecum.  The small bowel was not injured.  I then removed my Hassan trocar.  I then closed the hernia with three #1 Novafil sutures.  This completely obliterated the defect.  This also appeared to be repaired well looking with the laparoscope from underneath.  I then desufflated the abdomen and remove the remaining trochars.  I then tacked the umbilicus down with a 3-0 Vicryl.  I closed the incisions with 4-0 Monocryl and glue.  She tolerated this well was extubated and transferred to the recovery room in stable condition.

## 2019-06-30 ENCOUNTER — Encounter (HOSPITAL_COMMUNITY): Payer: Self-pay | Admitting: General Surgery

## 2019-07-01 LAB — SURGICAL PATHOLOGY

## 2019-07-23 ENCOUNTER — Telehealth: Payer: Self-pay | Admitting: *Deleted

## 2019-07-23 ENCOUNTER — Ambulatory Visit (AMBULATORY_SURGERY_CENTER): Payer: Self-pay | Admitting: *Deleted

## 2019-07-23 ENCOUNTER — Other Ambulatory Visit: Payer: Self-pay

## 2019-07-23 VITALS — Ht 66.0 in | Wt 172.2 lb

## 2019-07-23 DIAGNOSIS — Z1211 Encounter for screening for malignant neoplasm of colon: Secondary | ICD-10-CM

## 2019-07-23 MED ORDER — SUTAB 1479-225-188 MG PO TABS: 1.0000 | ORAL_TABLET | Freq: Once | ORAL | 0 refills | Status: AC

## 2019-07-23 NOTE — Telephone Encounter (Signed)
Dr. Silverio Decamp, I saw this pt in [facility] today.  She had an appendectomy and hernia repair on 06-29-19.  We moved her colonoscopy to 08-29-19- 2 months past surgery.  Would you like to wait longer or ok to proceed on this date?  Thanks, [employer]

## 2019-07-23 NOTE — Progress Notes (Signed)
Pt had appendectomy with hernia repair 06-28-19- procedure moved to 08-29-19 at 3:30 pm- see TE 07-23-19  Pt received second dose of covid vaccine in March  Pt is aware that care partner will wait in the car during procedure; if they feel like they will be too hot or cold to wait in the car; they may wait in the 4 th floor lobby. Patient is aware to bring only one care partner. We want them to wear a mask (we do not have any that we can provide them), practice social distancing, and we will check their temperatures when they get here.  I did remind the patient that their care partner needs to stay in the parking lot the entire time and have a cell phone available, we will call them when the pt is ready for discharge. Patient will wear mask into building.   No egg or soy allergy  No home oxygen use   No medications for weight loss taken  emmi information given  Pt denies constipation issues   No trouble with anesthesia, difficulty with intubation or hx/fam hx of malignant hyperthermia per pt   Sutab code put into RX and paper copy given to pt to show pharmacy

## 2019-07-28 NOTE — Telephone Encounter (Signed)
We will need to check with surgery if it is ok to proceed with colonoscopy or if she needs to wait a few more months. I will request Dr. Donne Hazel to review.   Thank you

## 2019-07-31 NOTE — Telephone Encounter (Signed)
Two months is plenty for her to get colonoscopy.  I just fixed a tiny umbilical hernia where I put a trocar. Was simple appy.

## 2019-08-01 NOTE — Telephone Encounter (Signed)
Ok thank you 

## 2019-08-04 ENCOUNTER — Encounter: Payer: Self-pay | Admitting: Gastroenterology

## 2019-08-06 ENCOUNTER — Encounter: Payer: Self-pay | Admitting: Gastroenterology

## 2019-08-07 NOTE — Telephone Encounter (Signed)
LMOM that ok received from surgeon to proceed with colonoscopy as scheduled

## 2019-08-28 ENCOUNTER — Telehealth: Payer: Self-pay

## 2019-08-28 NOTE — Telephone Encounter (Signed)
Called patient to offer an earlier appointment time for her procedure on 08/29/2019.  NALM

## 2019-08-29 ENCOUNTER — Encounter: Payer: Self-pay | Admitting: Gastroenterology

## 2019-08-29 ENCOUNTER — Ambulatory Visit (AMBULATORY_SURGERY_CENTER): Payer: No Typology Code available for payment source | Admitting: Gastroenterology

## 2019-08-29 ENCOUNTER — Other Ambulatory Visit: Payer: Self-pay

## 2019-08-29 VITALS — BP 102/66 | HR 59 | Temp 97.7°F | Resp 13 | Ht 66.0 in | Wt 172.0 lb

## 2019-08-29 DIAGNOSIS — D120 Benign neoplasm of cecum: Secondary | ICD-10-CM | POA: Diagnosis not present

## 2019-08-29 DIAGNOSIS — D124 Benign neoplasm of descending colon: Secondary | ICD-10-CM

## 2019-08-29 DIAGNOSIS — Z1211 Encounter for screening for malignant neoplasm of colon: Secondary | ICD-10-CM | POA: Diagnosis present

## 2019-08-29 MED ORDER — SODIUM CHLORIDE 0.9 % IV SOLN: 500.0000 mL | Freq: Once | INTRAVENOUS | Status: DC

## 2019-08-29 NOTE — Progress Notes (Signed)
Pt's states no medical or surgical changes since previsit or office visit.  CW - vitals 

## 2019-08-29 NOTE — Progress Notes (Signed)
Report to PACU, RN, vss, BBS= Clear.  

## 2019-08-29 NOTE — Op Note (Signed)
[facility] Patient Name: Wanda [name] Procedure Date: 08/29/2019 2:44 PM MRN: [id number] Endoscopist: Mauri Pole , MD Age: 54 Referring MD:  Date of Birth: 1965-12-23 Gender: Female Account #: [id number] Procedure:                Colonoscopy Indications:              Screening for colorectal malignant neoplasm Medicines:                Monitored Anesthesia Care Procedure:                Pre-Anesthesia Assessment:                           - Prior to the procedure, a History and Physical                            was performed, and patient medications and                            allergies were reviewed. The patient's tolerance of                            previous anesthesia was also reviewed. The risks                            and benefits of the procedure and the sedation                            options and risks were discussed with the patient.                            All questions were answered, and informed consent                            was obtained. Prior Anticoagulants: The patient has                            taken no previous anticoagulant or antiplatelet                            agents. ASA Grade Assessment: II - A patient with                            mild systemic disease. After reviewing the risks                            and benefits, the patient was deemed in                            satisfactory condition to undergo the procedure.                           After obtaining informed consent, the colonoscope  was passed under direct vision. Throughout the                            procedure, the patient's blood pressure, pulse, and                            oxygen saturations were monitored continuously. The                            Colonoscope was introduced through the anus and                            advanced to the the cecum, identified by                            appendiceal orifice  and ileocecal valve. The                            colonoscopy was performed without difficulty. The                            patient tolerated the procedure well. The quality                            of the bowel preparation was good. The ileocecal                            valve, appendiceal orifice, and rectum were                            photographed. Scope In: 2:56:19 PM Scope Out: 3:20:21 PM Scope Withdrawal Time: 0 hours 17 minutes 30 seconds  Total Procedure Duration: 0 hours 24 minutes 2 seconds  Findings:                 The perianal and digital rectal examinations were                            normal.                           Two sessile polyps were found in the descending                            colon and cecum. The polyps were 4 to 9 mm in size.                            These polyps were removed with a cold snare.                            Resection and retrieval were complete.                           Non-bleeding internal hemorrhoids were found during  retroflexion. The hemorrhoids were small. Complications:            No immediate complications. Estimated Blood Loss:     Estimated blood loss was minimal. Impression:               - Two 4 to 9 mm polyps in the descending colon and                            in the cecum, removed with a cold snare. Resected                            and retrieved.                           - Non-bleeding internal hemorrhoids. Recommendation:           - Patient has a contact number available for                            emergencies. The signs and symptoms of potential                            delayed complications were discussed with the                            patient. Return to normal activities tomorrow.                            Written discharge instructions were provided to the                            patient.                           - Resume previous diet.                            - Continue present medications.                           - Await pathology results.                           - Repeat colonoscopy in 5-10 years for surveillance                            based on pathology results. Mauri Pole, MD 08/29/2019 3:28:56 PM This report has been signed electronically.

## 2019-08-29 NOTE — Patient Instructions (Signed)
Handouts given:  Polyps Resume previous diet Continue current medications Await pathology results Repeat colonoscopy in 5-10 depending on pathology results   YOU HAD AN ENDOSCOPIC PROCEDURE TODAY AT [facility]:   Refer to the procedure report that was given to you for any specific questions about what was found during the examination.  If the procedure report does not answer your questions, please call your gastroenterologist to clarify.  If you requested that your care partner not be given the details of your procedure findings, then the procedure report has been included in a sealed envelope for you to review at your convenience later.  YOU SHOULD EXPECT: Some feelings of bloating in the abdomen. Passage of more gas than usual.  Walking can help get rid of the air that was put into your GI tract during the procedure and reduce the bloating. If you had a lower endoscopy (such as a colonoscopy or flexible sigmoidoscopy) you may notice spotting of blood in your stool or on the toilet paper. If you underwent a bowel prep for your procedure, you may not have a normal bowel movement for a few days.  Please Note:  You might notice some irritation and congestion in your nose or some drainage.  This is from the oxygen used during your procedure.  There is no need for concern and it should clear up in a day or so.  SYMPTOMS TO REPORT IMMEDIATELY:   Following lower endoscopy (colonoscopy or flexible sigmoidoscopy):  Excessive amounts of blood in the stool  Significant tenderness or worsening of abdominal pains  Swelling of the abdomen that is new, acute  Fever of 100F or higher   For urgent or emergent issues, a gastroenterologist can be reached at any hour by calling [phone]. Do not use MyChart messaging for urgent concerns.    DIET:  We do recommend a small meal at first, but then you may proceed to your regular diet.  Drink plenty of fluids but you should avoid  alcoholic beverages for 24 hours.  ACTIVITY:  You should plan to take it easy for the rest of today and you should NOT DRIVE or use heavy machinery until tomorrow (because of the sedation medicines used during the test).    FOLLOW UP: Our staff will call the number listed on your records 48-72 hours following your procedure to check on you and address any questions or concerns that you may have regarding the information given to you following your procedure. If we do not reach you, we will leave a message.  We will attempt to reach you two times.  During this call, we will ask if you have developed any symptoms of COVID 19. If you develop any symptoms (ie: fever, flu-like symptoms, shortness of breath, cough etc.) before then, please call [phone].  If you test positive for Covid 19 in the 2 weeks post procedure, please call and report this information to [place].    If any biopsies were taken you will be contacted by phone or by letter within the next 1-3 weeks.  Please call us at [phone] if you have not heard about the biopsies in 3 weeks.    SIGNATURES/CONFIDENTIALITY: You and/or your care partner have signed paperwork which will be entered into your electronic medical record.  These signatures attest to the fact that that the information above on your After Visit Summary has been reviewed and is understood.  Full responsibility of the confidentiality of this discharge information lies  with you and/or your care-partner.YOU HAD AN ENDOSCOPIC PROCEDURE TODAY AT [facility]:   Refer to the procedure report that was given to you for any specific questions about what was found during the examination.  If the procedure report does not answer your questions, please call your gastroenterologist to clarify.  If you requested that your care partner not be given the details of your procedure findings, then the procedure report has been included in a sealed envelope for you to review at  your convenience later.  YOU SHOULD EXPECT: Some feelings of bloating in the abdomen. Passage of more gas than usual.  Walking can help get rid of the air that was put into your GI tract during the procedure and reduce the bloating. If you had a lower endoscopy (such as a colonoscopy or flexible sigmoidoscopy) you may notice spotting of blood in your stool or on the toilet paper. If you underwent a bowel prep for your procedure, you may not have a normal bowel movement for a few days.  Please Note:  You might notice some irritation and congestion in your nose or some drainage.  This is from the oxygen used during your procedure.  There is no need for concern and it should clear up in a day or so.  SYMPTOMS TO REPORT IMMEDIATELY:   Following lower endoscopy (colonoscopy or flexible sigmoidoscopy):  Excessive amounts of blood in the stool  Significant tenderness or worsening of abdominal pains  Swelling of the abdomen that is new, acute  Fever of 100F or higher   For urgent or emergent issues, a gastroenterologist can be reached at any hour by calling [phone]. Do not use MyChart messaging for urgent concerns.    DIET:  We do recommend a small meal at first, but then you may proceed to your regular diet.  Drink plenty of fluids but you should avoid alcoholic beverages for 24 hours.  ACTIVITY:  You should plan to take it easy for the rest of today and you should NOT DRIVE or use heavy machinery until tomorrow (because of the sedation medicines used during the test).    FOLLOW UP: Our staff will call the number listed on your records 48-72 hours following your procedure to check on you and address any questions or concerns that you may have regarding the information given to you following your procedure. If we do not reach you, we will leave a message.  We will attempt to reach you two times.  During this call, we will ask if you have developed any symptoms of COVID 19. If you develop any  symptoms (ie: fever, flu-like symptoms, shortness of breath, cough etc.) before then, please call [phone].  If you test positive for Covid 19 in the 2 weeks post procedure, please call and report this information to [place].    If any biopsies were taken you will be contacted by phone or by letter within the next 1-3 weeks.  Please call us at [phone] if you have not heard about the biopsies in 3 weeks.    SIGNATURES/CONFIDENTIALITY: You and/or your care partner have signed paperwork which will be entered into your electronic medical record.  These signatures attest to the fact that that the information above on your After Visit Summary has been reviewed and is understood.  Full responsibility of the confidentiality of this discharge information lies with you and/or your care-partner.

## 2019-08-29 NOTE — Progress Notes (Signed)
Called to room to assist during endoscopic procedure.  Patient ID and intended procedure confirmed with present staff. Received instructions for my participation in the procedure from the performing physician.  

## 2019-09-02 ENCOUNTER — Telehealth: Payer: Self-pay

## 2019-09-02 NOTE — Telephone Encounter (Signed)
Left message on follow up call. 

## 2019-09-02 NOTE — Telephone Encounter (Signed)
  Follow up Call-  Call back number 08/29/2019  Post procedure Call Back phone  # [phone]  Permission to leave phone message Yes  Some recent data might be hidden     Patient questions:  Do you have a fever, pain , or abdominal swelling? No. Pain Score  0 *  Have you tolerated food without any problems? Yes.    Have you been able to return to your normal activities? Yes.    Do you have any questions about your discharge instructions: Diet   No. Medications  No. Follow up visit  No.  Do you have questions or concerns about your Care? No.  Actions: * If pain score is 4 or above: 1. No action needed, pain <4.Have you developed a fever since your procedure? no  2.   Have you had an respiratory symptoms (SOB or cough) since your procedure? no  3.   Have you tested positive for COVID 19 since your procedure no  4.   Have you had any family members/close contacts diagnosed with the COVID 19 since your procedure?  no   If yes to any of these questions please route to Joylene John, RN and Joella Prince, RN

## 2019-09-09 ENCOUNTER — Encounter: Payer: Self-pay | Admitting: Gastroenterology

## 2020-04-09 ENCOUNTER — Other Ambulatory Visit: Payer: Self-pay | Admitting: Family Medicine

## 2020-04-09 DIAGNOSIS — Z1231 Encounter for screening mammogram for malignant neoplasm of breast: Secondary | ICD-10-CM

## 2021-03-07 IMAGING — MG DIGITAL SCREENING BILAT W/ TOMO W/ CAD
8 series · 8 of 24 positions shown · non-contrast
Comparison: Previous exam(s).

CLINICAL DATA: Screening.

EXAM:
DIGITAL SCREENING BILATERAL MAMMOGRAM WITH TOMO AND CAD

[R CC synth-2D]
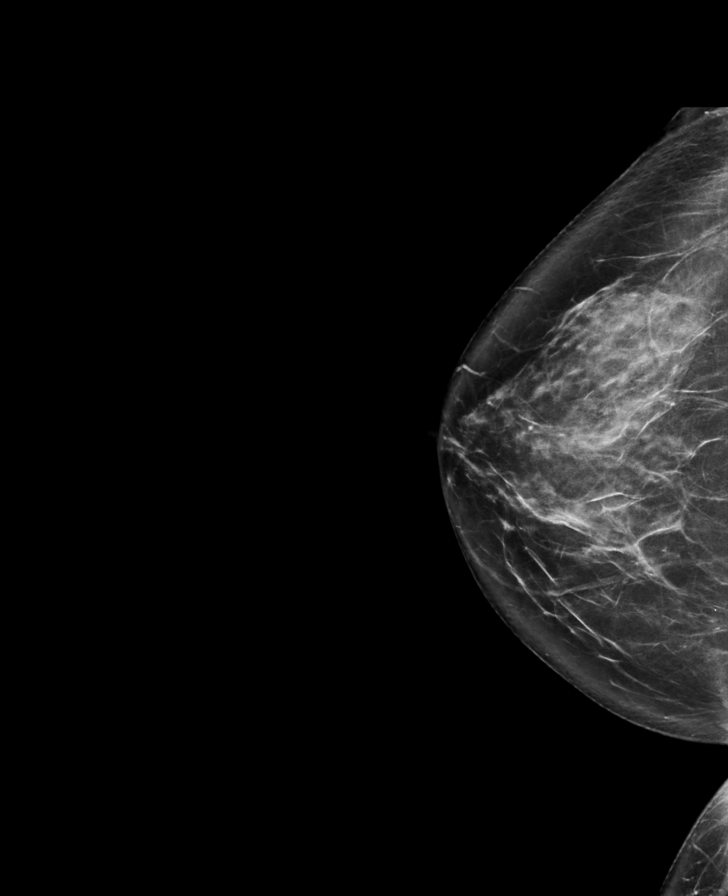

[L CC synth-2D]
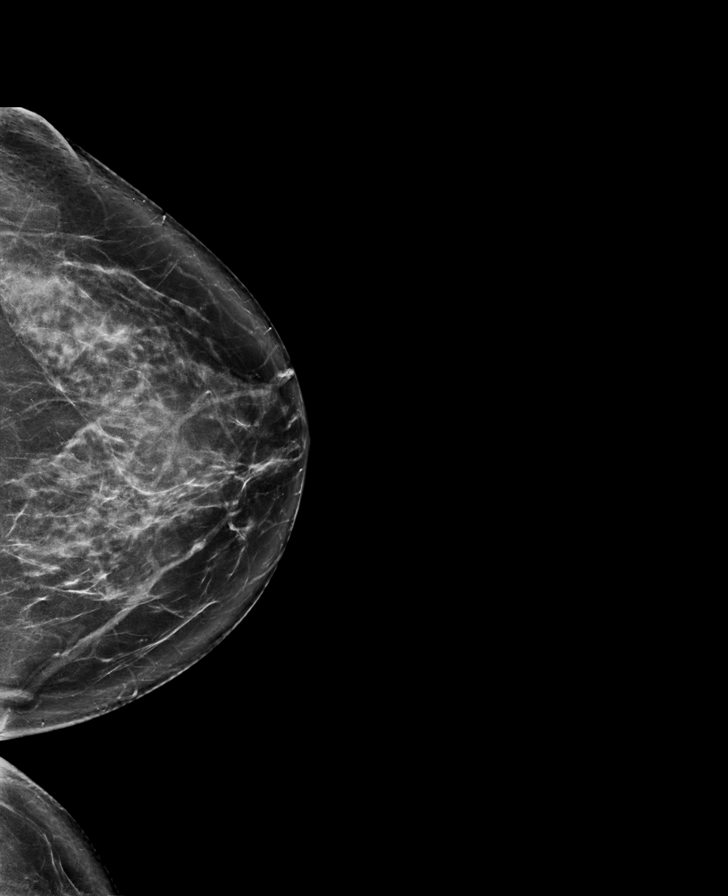

[R MLO synth-2D]
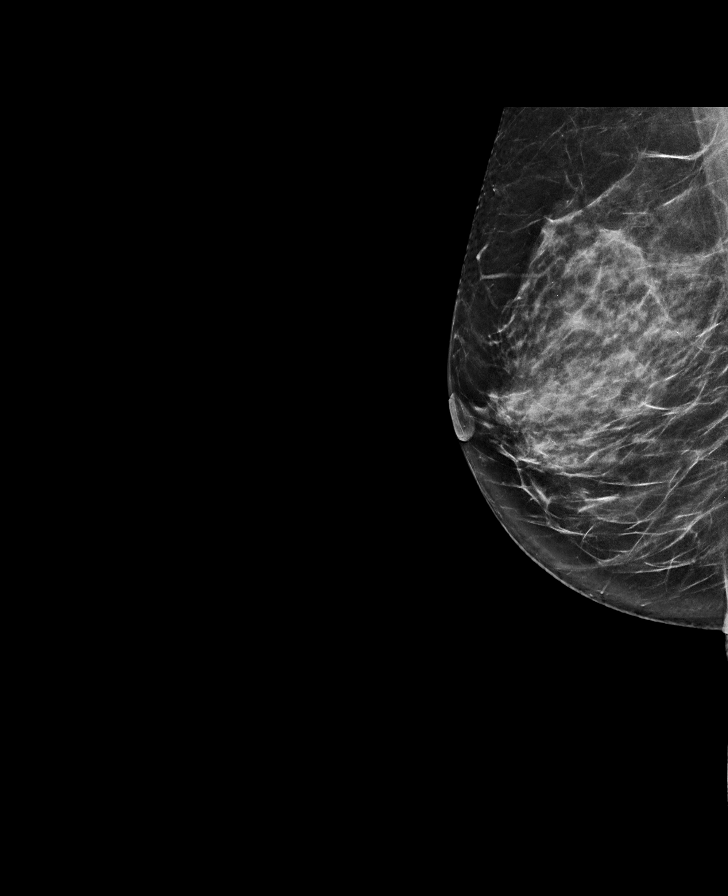

[L MLO synth-2D]
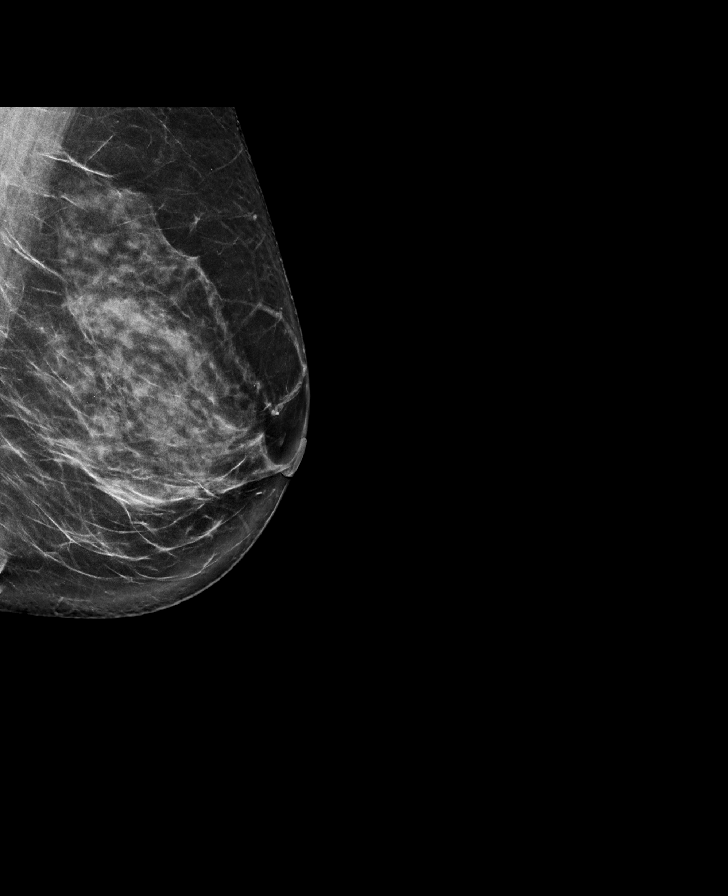

[L CC tomo · tomo slice 41/82.0]
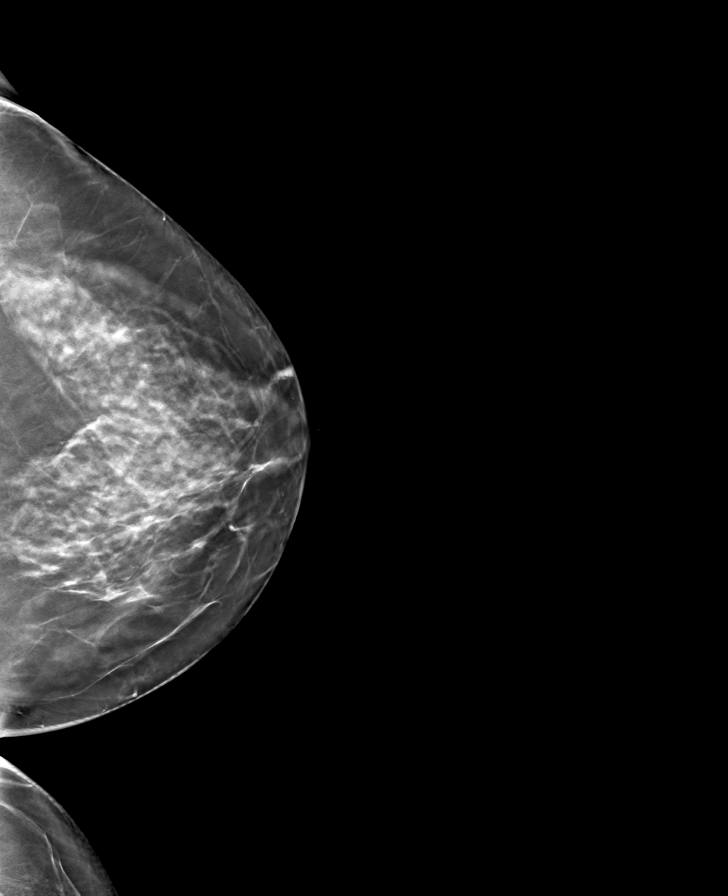

[L MLO tomo · tomo slice 41/81.0]
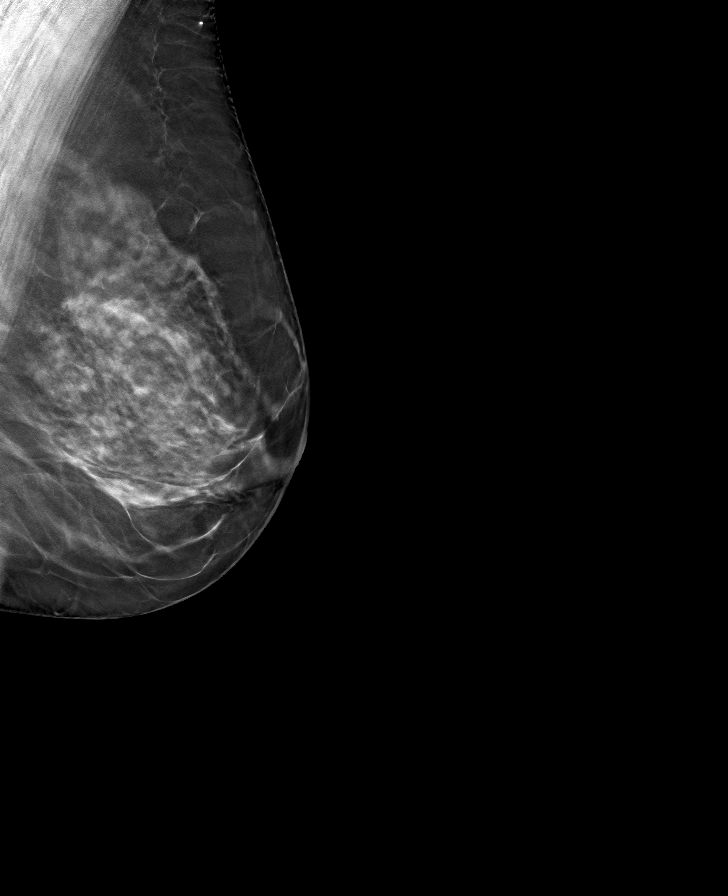

[R MLO tomo · tomo slice 37/72.0]
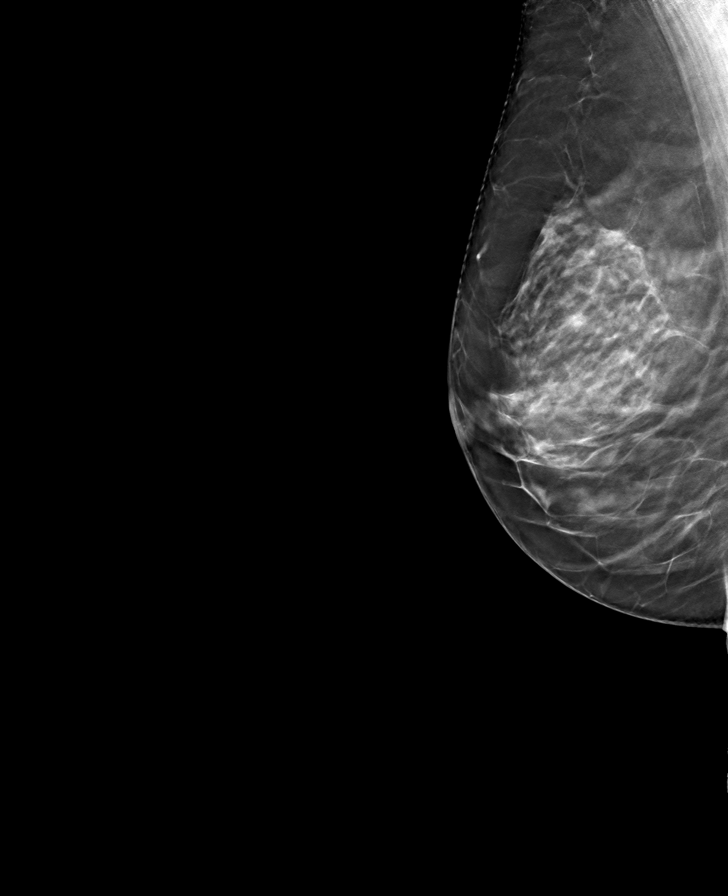

[R CC tomo · tomo slice 41/80.0]
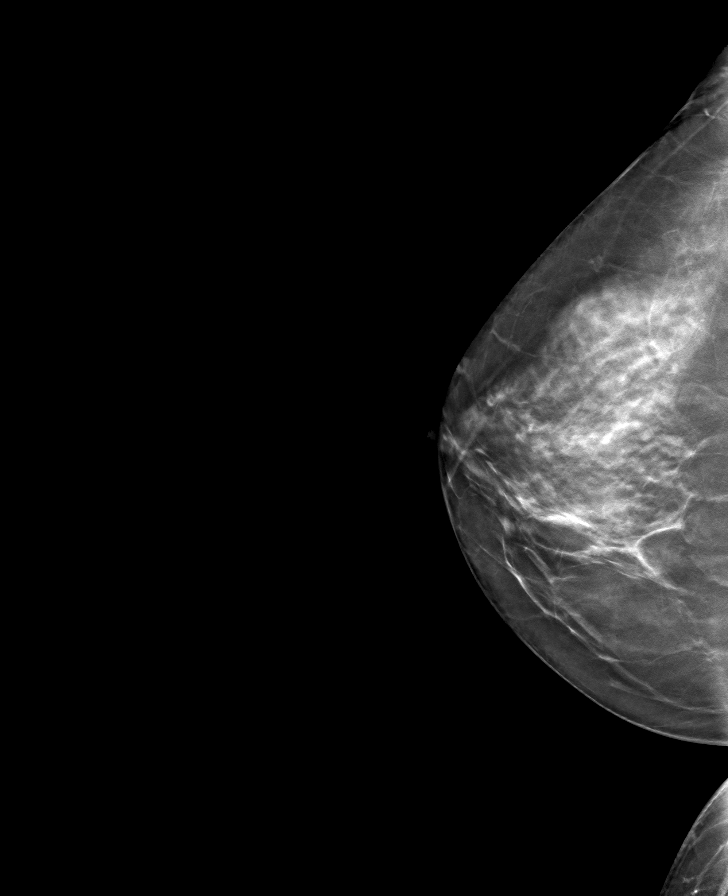

[8 of 24 positions shown; findings below may reference images not displayed]

ACR Breast Density Category c: The breast tissue is heterogeneously
dense, which may obscure small masses.
FINDINGS: There are no findings suspicious for malignancy. Images were
processed with CAD.
IMPRESSION: No mammographic evidence of malignancy. A result letter of this
screening mammogram will be mailed directly to the patient.

RECOMMENDATION:
Screening mammogram in one year. (Code:FT-U-LHB)

BI-RADS CATEGORY  1: Negative.

## 2021-04-12 IMAGING — CT CT ABD-PELV W/ CM
2 of 5 series · 16 of 46 positions shown, 18 images · IV contrast (Omni 300)
Comparison: None.

CLINICAL DATA: Right lower quadrant abdominal pain.

EXAM:
CT ABDOMEN AND PELVIS WITH CONTRAST
TECHNIQUE: Multidetector CT imaging of the abdomen and pelvis was performed
using the standard protocol following bolus administration of
intravenous contrast.
CONTRAST:  100mL OMNIPAQUE IOHEXOL 300 MG/ML  SOLN

[Series 3: a/p w/ 5mm · axial · 0.96mm/px · z∈[-126,+324]mm · 13 of 102 slices shown, 15 images]
[im 6/102  soft-tissue]
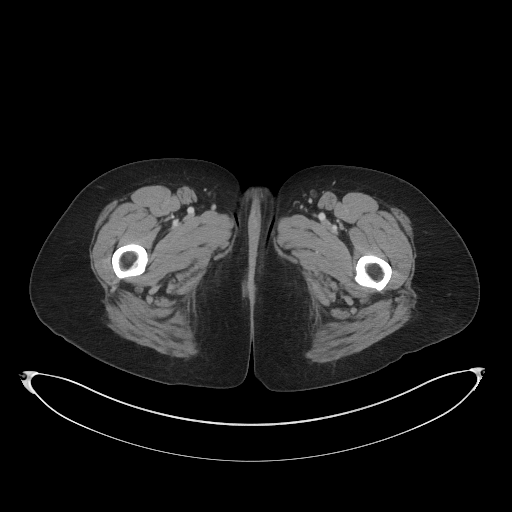
[im 6/102  bone]
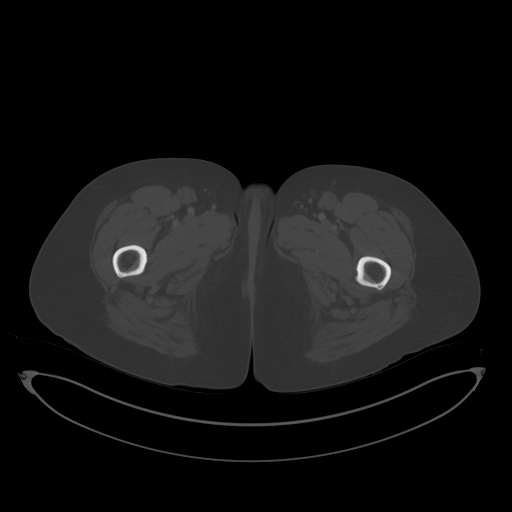
[im 16/102  soft-tissue]
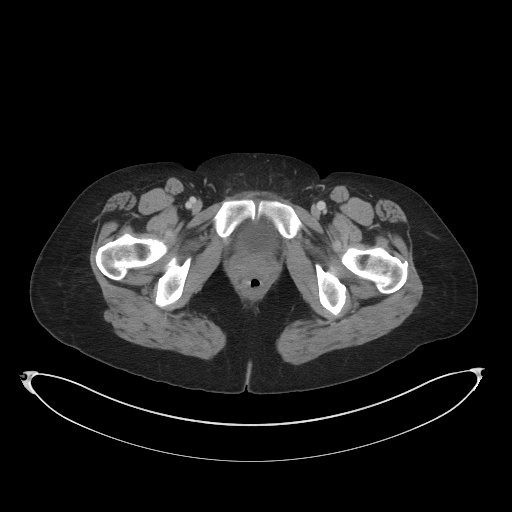
[im 22/102  soft-tissue]
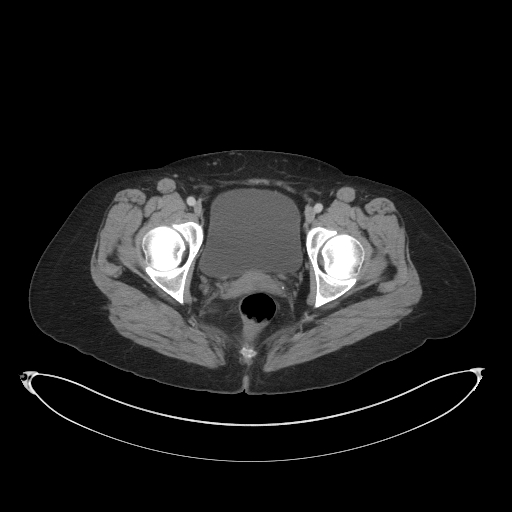
[im 27/102  soft-tissue]
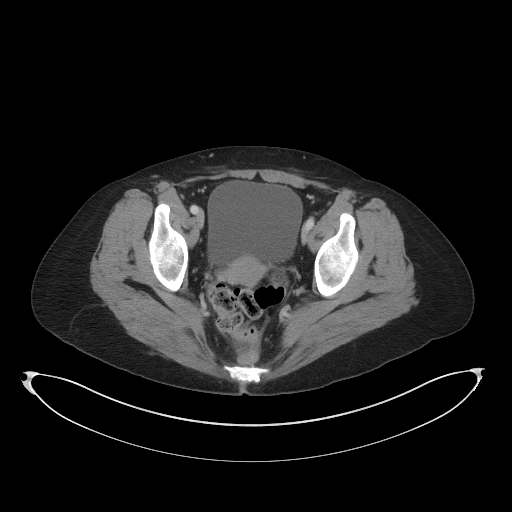
[im 38/102  soft-tissue]
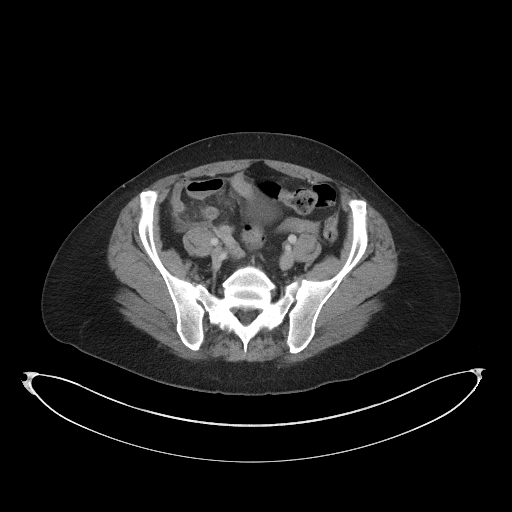
[im 43/102  soft-tissue]
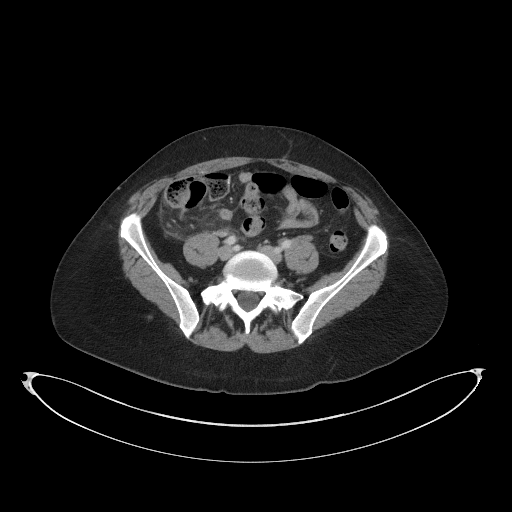
[im 54/102  soft-tissue]
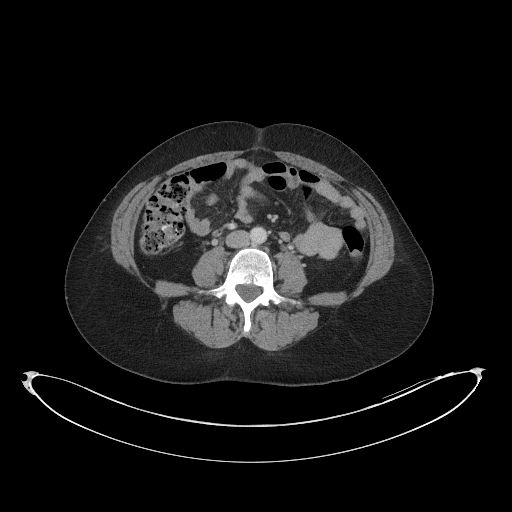
[im 59/102  soft-tissue]
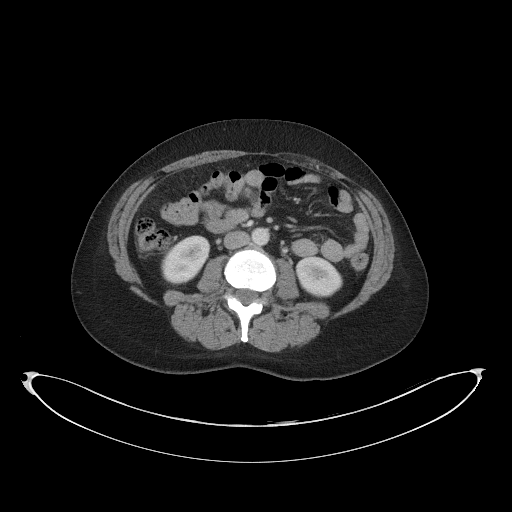
[im 64/102  soft-tissue]
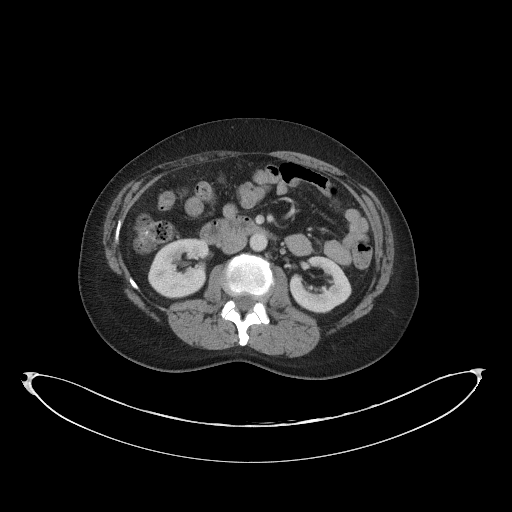
[im 64/102  bone]
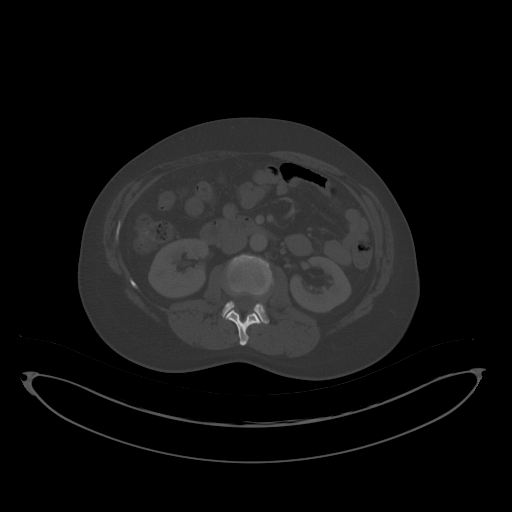
[im 75/102  soft-tissue]
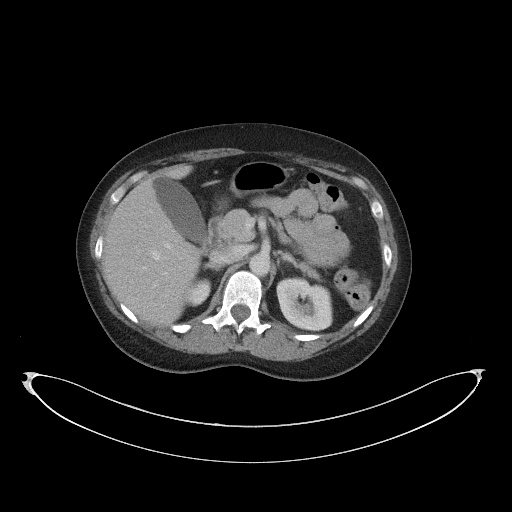
[im 80/102  soft-tissue]
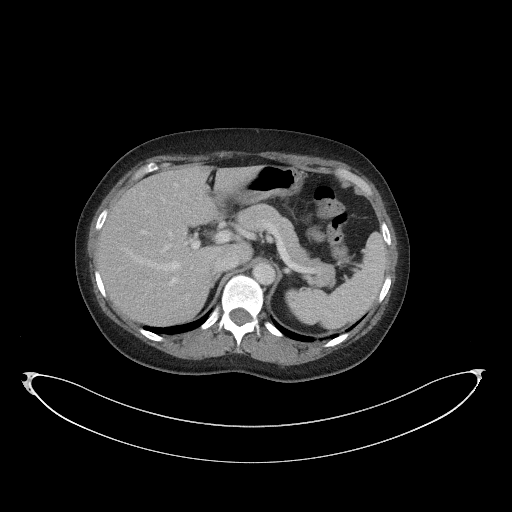
[im 86/102  soft-tissue]
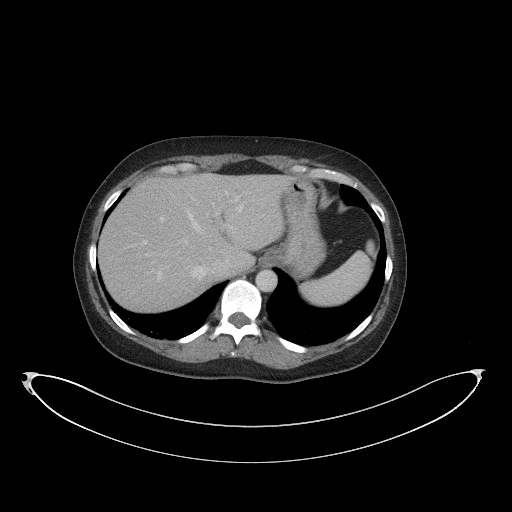
[im 96/102  soft-tissue]
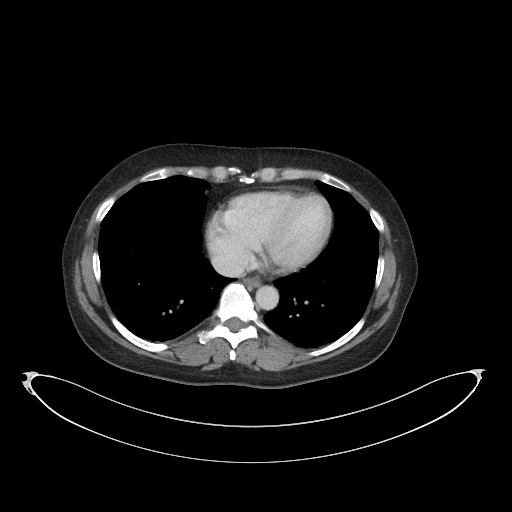

[Series 6: a/p w/ cor · coronal · 0.91mm/px · 3 of 146 slices shown]
[im 49/146  soft-tissue]
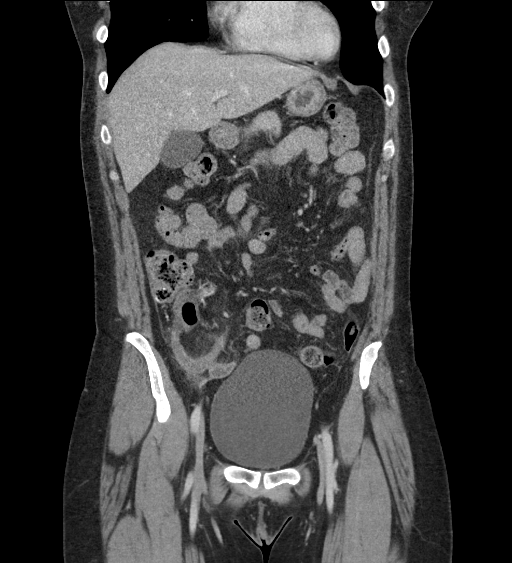
[im 65/146  soft-tissue]
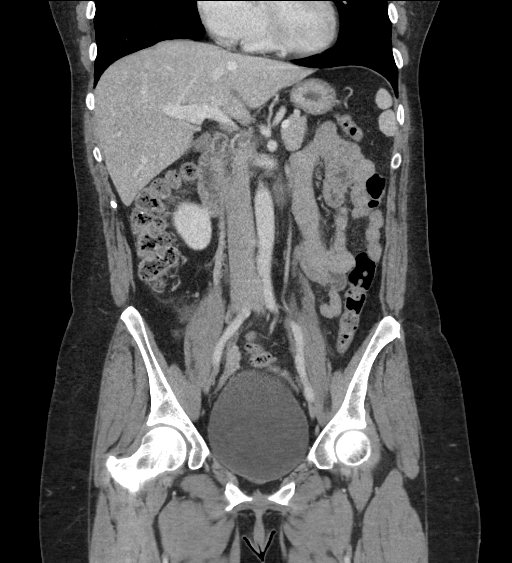
[im 81/146  soft-tissue]
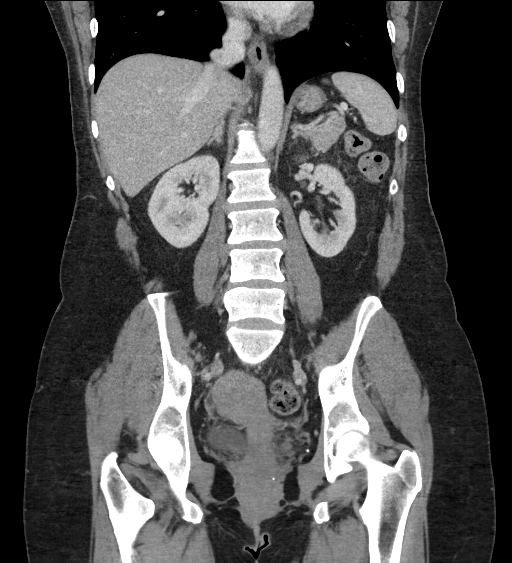

[16 of 46 positions shown; findings below may reference images not displayed]

FINDINGS: Lower chest: The lung bases are clear. The heart size is normal.

Hepatobiliary: The liver is normal. Normal gallbladder.There is no
biliary ductal dilation.

Pancreas: Normal contours without ductal dilatation. No
peripancreatic fluid collection.

Spleen: Unremarkable.

Adrenals/Urinary Tract:

--Adrenal glands: Unremarkable.

--Right kidney/ureter: A punctate nonobstructing stone is noted in
the lower pole.

--Left kidney/ureter: There is a punctate nonobstructing stone in
the interpolar region of the left kidney. The left renal collecting
system is partially duplicated.

--Urinary bladder: The urinary bladder is significantly distended.

Stomach/Bowel:

--Stomach/Duodenum: No hiatal hernia or other gastric abnormality.
Normal duodenal course and caliber.

--Small bowel: Unremarkable.

--Colon: Unremarkable.

--Appendix: The appendix is dilated with significant periappendiceal
inflammatory changes. The appendix measures up to approximately
cm in diameter. There is a small amount of reactive free fluid in
the patient's pelvis and right lower quadrant. There is no
extraluminal free air. There is no well-formed fluid collection
concerning for an abscess.

Vascular/Lymphatic: Normal course and caliber of the major abdominal
vessels.

--No retroperitoneal lymphadenopathy.

--No mesenteric lymphadenopathy.

--No pelvic or inguinal lymphadenopathy.

Reproductive: Unremarkable

Other: There is a small amount of reactive free fluid in the
patient's abdomen and pelvis. There is a fat containing umbilical
hernia.

Musculoskeletal. No acute displaced fractures.
IMPRESSION: 1. Acute appendicitis without evidence for perforation or abscess
formation.
2. Distended urinary bladder.
3. Fat containing umbilical hernia.
4. Bilateral nonobstructive nephrolithiasis.

## 2022-01-02 ENCOUNTER — Emergency Department (HOSPITAL_BASED_OUTPATIENT_CLINIC_OR_DEPARTMENT_OTHER)
Admission: EM | Admit: 2022-01-02 | Discharge: 2022-01-02 | Discharge status: Against Medical Advice | Payer: No Typology Code available for payment source | Attending: Emergency Medicine | Admitting: Emergency Medicine

## 2022-01-02 ENCOUNTER — Encounter (HOSPITAL_BASED_OUTPATIENT_CLINIC_OR_DEPARTMENT_OTHER): Payer: Self-pay | Admitting: *Deleted

## 2022-01-02 ENCOUNTER — Other Ambulatory Visit: Payer: Self-pay

## 2022-01-02 DIAGNOSIS — S61212A Laceration without foreign body of right middle finger without damage to nail, initial encounter: Secondary | ICD-10-CM | POA: Insufficient documentation

## 2022-01-02 DIAGNOSIS — W272XXA Contact with scissors, initial encounter: Secondary | ICD-10-CM | POA: Diagnosis not present

## 2022-01-02 DIAGNOSIS — Z5321 Procedure and treatment not carried out due to patient leaving prior to being seen by health care provider: Secondary | ICD-10-CM | POA: Insufficient documentation

## 2022-01-02 NOTE — ED Triage Notes (Signed)
Pt cut herself with some scissors between her right middle and ring finger and states that it continued to ooze.  Last tetanus within 5 years.  Gauze dressing applied

## 2022-01-02 NOTE — ED Notes (Signed)
72m with minimal oozing after she pulls it apart

## 2022-01-23 ENCOUNTER — Encounter (HOSPITAL_BASED_OUTPATIENT_CLINIC_OR_DEPARTMENT_OTHER): Payer: No Typology Code available for payment source | Attending: Internal Medicine | Admitting: Internal Medicine

## 2022-01-23 DIAGNOSIS — L97812 Non-pressure chronic ulcer of other part of right lower leg with fat layer exposed: Secondary | ICD-10-CM | POA: Insufficient documentation

## 2022-01-23 DIAGNOSIS — I872 Venous insufficiency (chronic) (peripheral): Secondary | ICD-10-CM | POA: Diagnosis not present

## 2022-01-23 DIAGNOSIS — Z85828 Personal history of other malignant neoplasm of skin: Secondary | ICD-10-CM | POA: Diagnosis not present

## 2022-01-23 DIAGNOSIS — C4491 Basal cell carcinoma of skin, unspecified: Secondary | ICD-10-CM

## 2022-01-23 DIAGNOSIS — T8131XA Disruption of external operation (surgical) wound, not elsewhere classified, initial encounter: Secondary | ICD-10-CM | POA: Diagnosis not present

## 2022-01-23 NOTE — Progress Notes (Signed)
Wanda, [name] [name] ([id number]) [phone] [id number] Page 1 of 4 Visit Report for 01/23/2022 Abuse Risk Screen Details Patient Name: Date of Service: Wanda [name], Wanda [name] 01/23/2022 8:00 A M Medical Record Number: [id number] Patient Account Number: [id number] Date of Birth/Sex: Treating RN: 1965/02/06 (57 y.o. Wanda [name] Primary Care Dekari Bures: Fanny Bien Other Clinician: Referring Nasir Bright: Treating Jacquie Lukes/Extender: Lenis Dickinson in Treatment: 0 Abuse Risk Screen Items Answer ABUSE RISK SCREEN: Has anyone close to you tried to hurt or harm you recentlyo No Do you feel uncomfortable with anyone in your familyo No Has anyone forced you do things that you didnt want to doo No Electronic Signature(s) Signed: 01/23/2022 4:22:55 PM By: Rhae Hammock RN Entered By: Rhae Hammock on 01/23/2022 08:22:54 -------------------------------------------------------------------------------- Activities of Daily Living Details Patient Name: Date of Service: Wanda, [name] 01/23/2022 8:00 A M Medical Record Number: [id number] Patient Account Number: [id number] Date of Birth/Sex: Treating RN: 11-22-1965 (57 y.o. Wanda [name] Primary Care Yarelly Kuba: Fanny Bien Other Clinician: Referring Jacklyn Branan: Treating Riely Oetken/Extender: Lenis Dickinson in Treatment: 0 Activities of Daily Living Items Answer Activities of Daily Living (Please select one for each item) Drive Automobile Completely Able T Medications ake Completely Able Use T elephone Completely Able Care for Appearance Completely Able Use T oilet Completely Able Bath / Shower Completely Able Dress Self Completely Able Feed Self Completely Able Walk Completely Able Get In / Out Bed Completely Able Housework Completely Able Prepare Meals Completely [place] for Self Completely Able Electronic  Signature(s) Signed: 01/23/2022 4:22:55 PM By: Rhae Hammock RN Entered By: Rhae Hammock on 01/23/2022 08:23:22 Dobransky, Pearletha Forge ([id number]) [id number] Page 2 of 4 -------------------------------------------------------------------------------- Education Screening Details Patient Name: Date of Service: Wanda, [name] 01/23/2022 8:00 A M Medical Record Number: [id number] Patient Account Number: [id number] Date of Birth/Sex: Treating RN: [birth year] (57 y.o. Wanda [name] Primary Care Ahsan Esterline: Fanny Bien Other Clinician: Referring Eirene Rather: Treating Prabhav Faulkenberry/Extender: Lenis Dickinson in Treatment: 0 Primary Learner Assessed: Patient Learning Preferences/Education Level/Primary Language Learning Preference: Explanation, Demonstration, Communication Board, Printed Material Highest Education Level: College or Above Preferred Language: English Cognitive Barrier Language Barrier: No Translator Needed: No Memory Deficit: No Emotional Barrier: No Cultural/Religious Beliefs Affecting Medical Care: No Physical Barrier Impaired Vision: No Impaired Hearing: No Decreased Hand dexterity: No Knowledge/Comprehension Knowledge Level: High Comprehension Level: High Ability to understand written instructions: High Ability to understand verbal instructions: High Motivation Anxiety Level: Calm Cooperation: Cooperative Education Importance: Denies Need Interest in Health Problems: Asks Questions Perception: Coherent Willingness to Engage in Self-Management High Activities: Readiness to Engage in Self-Management High Activities: Electronic Signature(s) Signed: 01/23/2022 4:22:55 PM By: Rhae Hammock RN Entered By: Rhae Hammock on 01/23/2022 08:24:25 -------------------------------------------------------------------------------- Fall Risk Assessment Details Patient Name: Date of Service: Wanda Kilts [name]. 01/23/2022 8:00 A M Medical Record Number: [id number] Patient Account Number: [id number] Date of Birth/Sex: Treating RN: 1965-08-09 (57 y.o. Wanda [name] Primary Care Abi Shoults: Fanny Bien Other Clinician: Referring Anique Beckley: Treating Kewanda Poland/Extender: Lenis Dickinson in Treatment: 0 Fall Risk Assessment Items Have you had 2 or more falls in the last [address] monthso 0 No Quade, Pearletha Forge ([id number]) [phone] [id number] Page 3 of 4 Have you had any fall that resulted in injury in the last 12 monthso 0 No FALLS RISK SCREEN History of falling - immediate or within 3 months 0 No Secondary diagnosis (Do you have 2 or  more medical diagnoseso) 0 No Ambulatory aid None/bed rest/wheelchair/nurse 0 No Crutches/cane/walker 0 No Furniture 0 No Intravenous therapy Access/Saline/Heparin Lock 0 No Gait/Transferring Normal/ bed rest/ wheelchair 0 No Weak (short steps with or without shuffle, stooped but able to lift head while walking, may seek 0 No support from furniture) Impaired (short steps with shuffle, may have difficulty arising from chair, head down, impaired 0 No balance) Mental Status Oriented to own ability 0 No Electronic Signature(s) Signed: 01/23/2022 4:22:55 PM By: Rhae Hammock RN Entered By: Rhae Hammock on 01/23/2022 08:24:31 -------------------------------------------------------------------------------- Foot Assessment Details Patient Name: Date of Service: Wanda Kilts [name]. 01/23/2022 8:00 A M Medical Record Number: [id number] Patient Account Number: [id number] Date of Birth/Sex: Treating RN: 1965-12-07 (57 y.o. Wanda [name] Primary Care [name]: Fanny Bien Other Clinician: Referring Alessandra Sawdey: Treating Rene Gonsoulin/Extender: Kirby Funk Weeks in Treatment: 0 Foot Assessment Items Site Locations + = Sensation present, - = Sensation absent, C = Callus, U =  Ulcer R = Redness, W = Warmth, M = Maceration, PU = Pre-ulcerative lesion [name] = Fissure, S = Swelling, D = Dryness Assessment Right: Left: Other Deformity: No No Prior Foot Ulcer: No No Prior Amputation: No No Charcot Joint: No No Ambulatory Status: Ambulatory Without Help GaitULANI, DEGRASSE ([id number]) [phone] [id number] Page 4 of 4 Electronic Signature(s) Signed: 01/23/2022 4:22:55 PM By: Rhae Hammock RN Entered By: Rhae Hammock on 01/23/2022 08:25:14 -------------------------------------------------------------------------------- Nutrition Risk Screening Details Patient Name: Date of Service: DECARLA, SIEMEN 01/23/2022 8:00 A M Medical Record Number: [id number] Patient Account Number: [id number] Date of Birth/Sex: Treating RN: Feb 03, 1965 (57 y.o. Wanda [name] Primary Care Carmesha [place]: Fanny Bien Other Clinician: Referring Anaisabel Pederson: Treating Fronia Depass/Extender: Kirby Funk Weeks in Treatment: 0 Height (in): 66 Weight (lbs): 175 Body Mass Index (BMI): 28.2 Nutrition Risk Screening Items Score Screening NUTRITION RISK SCREEN: I have an illness or condition that made me change the kind and/or amount of food I eat 0 No I eat fewer than two meals per day 0 No I eat few fruits and vegetables, or milk products 0 No I have three or more drinks of beer, liquor or wine almost every day 0 No I have tooth or mouth problems that make it hard for me to eat 0 No I don't always have enough money to buy the food I need 0 No I eat alone most of the time 0 No I take three or more different prescribed or over-the-counter drugs a day 0 No Without wanting to, I have lost or gained 10 pounds in the last six months 0 No I am not always physically able to shop, cook and/or feed myself 0 No Nutrition Protocols Good Risk Protocol 0 No interventions needed Moderate Risk Protocol High Risk Proctocol Risk Level: Good  Risk Score: 0 Electronic Signature(s) Signed: 01/23/2022 4:22:55 PM By: Rhae Hammock RN Entered By: Rhae Hammock on 01/23/2022 08:24:37

## 2022-01-24 NOTE — Progress Notes (Signed)
Wanda, [name] [place] F ([id number]) [id number] Page 1 of 9 Visit Report for 01/23/2022 Chief Complaint Document Details Patient Name: Date of Service: Wanda [name], Wanda [name] 01/23/2022 8:00 A M Medical Record Number: [id number] Patient Account Number: [id number] Date of Birth/Sex: Treating RN: 1965-05-09 (57 y.o. F) Primary Care Provider: Fanny Bien Other Clinician: Referring Provider: Treating Provider/Extender: Lenis Dickinson in Treatment: 0 Information Obtained from: Patient Chief Complaint 01/23/2022; right lower extremity wound s/p basal cell carcinoma removal Electronic Signature(s) Signed: 01/23/2022 12:33:43 PM By: Kalman Shan DO Entered By: Kalman Shan on 01/23/2022 09:09:48 -------------------------------------------------------------------------------- Debridement Details Patient Name: Date of Service: Wanda Kilts F. 01/23/2022 8:00 A M Medical Record Number: [id number] Patient Account Number: [id number] Date of Birth/Sex: Treating RN: 05/27/1965 (57 y.o. Wanda [name] Primary Care Provider: Fanny Bien Other Clinician: Referring Provider: Treating Provider/Extender: Lenis Dickinson in Treatment: 0 Debridement Performed for Assessment: Wound #1 Right,Medial Lower Leg Performed By: Clinician Deon Pilling, RN Debridement Type: Chemical/Enzymatic/Mechanical Agent Used: gauze and wound cleanser Level of Consciousness (Pre-procedure): Awake and Alert Pre-procedure Verification/Time Out No Taken: Bleeding: Moderate Response to Treatment: Procedure was tolerated well Level of Consciousness (Post- Awake and Alert procedure): Post Debridement Measurements of Total Wound Length: (cm) 1.5 Width: (cm) 1.4 Depth: (cm) 0.1 Volume: (cm) 0.165 Character of Wound/Ulcer Post Debridement: Stable Post Procedure Diagnosis Same as Pre-procedure Electronic Signature(s) Signed: 01/23/2022  12:33:43 PM By: Kalman Shan DO Signed: 01/23/2022 6:29:44 PM By: Deon Pilling RN, BSN Entered By: Deon Pilling on 01/23/2022 09:03:24 [place], Wanda [name] ([id number]) [id number] Page 2 of 9 -------------------------------------------------------------------------------- HPI Details Patient Name: Date of Service: [name], Wanda 01/23/2022 8:00 A M Medical Record Number: [id number] Patient Account Number: [id number] Date of Birth/Sex: Treating RN: 1965/07/22 (57 y.o. F) Primary Care Provider: Fanny Bien Other Clinician: Referring Provider: Treating Provider/Extender: Lenis Dickinson in Treatment: 0 History of Present Illness HPI Description: 01/23/2022 Ms. Wanda [name] Is a 57 year old female with a past medical history of basal cell carcinoma to the right lower extremity status post removal of lesion on 11/2 by Dr. Dennison Mascot. She had sutures in place however the area dehisced postsurgery There was concern for possible infection and she was on doxycycline. She was referred to Dr. Iran Planas for slow healing wound. She has been using collagen to the area. Currently she denies signs of infection. She states there has been improvement in wound healing albeit slow. Electronic Signature(s) Signed: 01/23/2022 12:33:43 PM By: Kalman Shan DO Entered By: Kalman Shan on 01/23/2022 09:24:01 -------------------------------------------------------------------------------- Physical Exam Details Patient Name: Date of Service: Wanda Kilts F. 01/23/2022 8:00 A M Medical Record Number: [id number] Patient Account Number: [id number] Date of Birth/Sex: Treating RN: Sep 30, 1965 (57 y.o. F) Primary Care Provider: Fanny Bien Other Clinician: Referring Provider: Treating Provider/Extender: Lenis Dickinson in Treatment: 0 Constitutional respirations regular, non-labored and within target range for  patient.. Cardiovascular 2+ dorsalis pedis/posterior tibialis pulses. Psychiatric pleasant and cooperative. Notes Right lower extremity: T the distal medial aspect there is an open wound with granulation tissue and epithelization occurring to the edges circumferentially. o Venous stasis dermatitis. 2+ pitting edema to the knee. No signs of infection including increased warmth, erythema or purulent drainage. Electronic Signature(s) Signed: 01/23/2022 12:33:43 PM By: Kalman Shan DO Entered By: Kalman Shan on 01/23/2022 09:24:36 -------------------------------------------------------------------------------- Physician Orders Details Patient Name: Date of Service: Wanda Kilts F. 01/23/2022 8:00 A M Medical Record Number: [id number] Patient Account Number: [id number] [place],  Wanda [name] ([id number]) [id number] Page 3 of 9 Date of Birth/Sex: Treating RN: [birth year] (57 y.o. Wanda [name] Primary Care Provider: Fanny Bien Other Clinician: Referring Provider: Treating Provider/Extender: Lenis Dickinson in Treatment: 0 Verbal / Phone Orders: No Diagnosis Coding ICD-10 Coding Code Description [phone] Non-pressure chronic ulcer of other part of right lower leg with fat layer exposed T81.31XA Disruption of external operation (surgical) wound, not elsewhere classified, initial encounter C44.91 Basal cell carcinoma of skin, unspecified Follow-up Appointments ppointment in 1 week. - Dr. Heber [place] [facility] 8 01/30/2022 1115 Return A ppointment in 2 weeks. - Dr. Heber [place] Room 8 1/22/224 1115 Return A Return appointment in 3 weeks. - Dr. Heber [place] Room 8 1/29/224 0800 Anesthetic (In clinic) Topical Lidocaine 4% applied to wound bed Bathing/ Shower/ Hygiene May shower with protection but do not get wound dressing(s) wet. Protect dressing(s) with water repellant cover (for example, large plastic bag) or a cast cover and may then take  shower. Edema Control - Lymphedema / SCD / Other Elevate legs to the level of the heart or above for 30 minutes daily and/or when sitting for 3-4 times a day throughout the day. Avoid standing for long periods of time. Exercise regularly Wound Treatment Wound #1 - Lower Leg Wound Laterality: Right, Medial Cleanser: Soap and Water 1 x Per Week/30 Days Discharge Instructions: May shower and wash wound with dial antibacterial soap and water prior to dressing change. Cleanser: Wound Cleanser 1 x Per Week/30 Days Discharge Instructions: Cleanse the wound with wound cleanser prior to applying a clean dressing using gauze sponges, not tissue or cotton balls. Peri-Wound Care: Sween Lotion (Moisturizing lotion) 1 x Per Week/30 Days Discharge Instructions: Apply moisturizing lotion as directed Prim Dressing: Hydrofera Blue Ready Transfer Foam, 4x5 (in/in) ary 1 x Per Week/30 Days Discharge Instructions: Apply over medihoney. Prim Dressing: MediHoney Gel, tube 1.5 (oz) 1 x Per Week/30 Days ary Discharge Instructions: Apply directly to wound bed. Secondary Dressing: ABD Pad, 8x10 1 x Per Week/30 Days Discharge Instructions: Apply over primary dressing as directed. Compression Wrap: Kerlix Roll 4.5x3.1 (in/yd) 1 x Per Week/30 Days Discharge Instructions: Apply Kerlix and Coban compression as directed. Compression Wrap: Coban Self-Adherent Wrap 4x5 (in/yd) 1 x Per Week/30 Days Discharge Instructions: Apply over Kerlix as directed. Patient Medications llergies: No Known Allergies A Notifications Medication Indication Start End 01/23/2022 lidocaine DOSE topical 4 % cream - cream topical once daily for debridements only Electronic Signature(s) Signed: 01/23/2022 12:33:43 PM By: Kalman Shan DO Entered By: Kalman Shan on 01/23/2022 09:24:43 [place], Wanda [name] ([id number]) [id number] Page 4 of  9 -------------------------------------------------------------------------------- Problem List Details Patient Name: Date of Service: MARTASIA, TALAMANTE 01/23/2022 8:00 A M Medical Record Number: [id number] Patient Account Number: [id number] Date of Birth/Sex: Treating RN: 1965/12/22 (57 y.o. Wanda [name] Primary Care Provider: Fanny Bien Other Clinician: Referring Provider: Treating Provider/Extender: Lenis Dickinson in Treatment: 0 Active Problems ICD-10 Encounter Code Description Active Date MDM Diagnosis [phone] Non-pressure chronic ulcer of other part of right lower leg with fat layer 01/23/2022 No Yes exposed T81.31XA Disruption of external operation (surgical) wound, not elsewhere classified, 01/23/2022 No Yes initial encounter C44.91 Basal cell carcinoma of skin, unspecified 01/23/2022 No Yes I87.321 Chronic venous hypertension (idiopathic) with inflammation of right lower 01/23/2022 No Yes extremity Inactive Problems Resolved Problems Electronic Signature(s) Signed: 01/23/2022 12:33:43 PM By: Kalman Shan DO Entered By: Kalman Shan on 01/23/2022 09:09:33 -------------------------------------------------------------------------------- Progress Note Details Patient Name: Date of  Service: JARYIAH, MEHLMAN 01/23/2022 8:00 A M Medical Record Number: [id number] Patient Account Number: [id number] Date of Birth/Sex: Treating RN: 06-01-1965 (57 y.o. F) Primary Care Provider: Fanny Bien Other Clinician: Referring Provider: Treating Provider/Extender: Lenis Dickinson in Treatment: 0 Subjective Chief Complaint Information obtained from Patient 01/23/2022; right lower extremity wound s/p basal cell carcinoma removal History of Present Illness (HPI) 01/23/2022 Ms. Dara Beidleman Is a 57 year old female with a past medical history of basal cell carcinoma to the right lower extremity status post removal of lesion  on JERUSALEN, MATEJA F ([id number]) [id number] Page 5 of 9 11/2 by Dr. Dennison Mascot. She had sutures in place however the area dehisced postsurgery There was concern for possible infection and she was on doxycycline. She was referred to Dr. Iran Planas for slow healing wound. She has been using collagen to the area. Currently she denies signs of infection. She states there has been improvement in wound healing albeit slow. Patient History Information obtained from Patient, Chart. Allergies No Known Allergies Family History Unknown History. Social History Never smoker, Marital Status - Married, Alcohol Use - Never, Drug Use - No History, Caffeine Use - Rarely. Medical History Hospitalization/Surgery History - lap appe. - umbilical hernia repair. - C-section. Review of Systems (ROS) Constitutional Symptoms (General Health) Denies complaints or symptoms of Fatigue, Fever, Chills, Marked Weight Change. Eyes Denies complaints or symptoms of Dry Eyes, Vision Changes, Glasses / Contacts. Ear/Nose/Mouth/Throat Denies complaints or symptoms of Chronic sinus problems or rhinitis. Respiratory Denies complaints or symptoms of Chronic or frequent coughs, Shortness of Breath. Cardiovascular Denies complaints or symptoms of Chest pain. Gastrointestinal Denies complaints or symptoms of Frequent diarrhea, Nausea, Vomiting. Endocrine Denies complaints or symptoms of Heat/cold intolerance. Genitourinary Denies complaints or symptoms of Frequent urination. Integumentary (Skin) Complains or has symptoms of Wounds. Musculoskeletal Denies complaints or symptoms of Muscle Pain, Muscle Weakness. Neurologic Denies complaints or symptoms of Numbness/parasthesias. Psychiatric Denies complaints or symptoms of Claustrophobia. Objective Constitutional respirations regular, non-labored and within target range for patient.. Vitals Time Taken: 8:15 AM, Height: 66 in, Weight: 175 lbs, BMI:  28.2, Temperature: 98.0 F, Pulse: 76 bpm, Respiratory Rate: 20 breaths/min, Blood Pressure: 106/65 mmHg. Cardiovascular 2+ dorsalis pedis/posterior tibialis pulses. Psychiatric pleasant and cooperative. General Notes: Right lower extremity: T the distal medial aspect there is an open wound with granulation tissue and epithelization occurring to the edges o circumferentially. Venous stasis dermatitis. 2+ pitting edema to the knee. No signs of infection including increased warmth, erythema or purulent drainage. Integumentary (Hair, Skin) Wound #1 status is Open. Original cause of wound was Surgical Injury. The date acquired was: 11/17/2021. The wound is located on the Right,Medial Lower Leg. The wound measures 1.5cm length x 1.4cm width x 0.1cm depth; 1.649cm^2 area and 0.165cm^3 volume. There is Fat Layer (Subcutaneous Tissue) exposed. There is no tunneling or undermining noted. There is a medium amount of serosanguineous drainage noted. The wound margin is distinct with the outline attached to the wound base. There is large (67-100%) red, pink granulation within the wound bed. There is a small (1-33%) amount of necrotic tissue within the wound bed including Adherent Slough. The periwound skin appearance did not exhibit: Callus, Crepitus, Excoriation, Induration, Rash, Scarring, Dry/Scaly, Maceration, Atrophie Blanche, Cyanosis, Ecchymosis, Hemosiderin Staining, Mottled, Pallor, Rubor, Erythema. Periwound temperature was noted as No Abnormality. The periwound has tenderness on palpation. Assessment QUINTARA, BOST ([id number]) [id number] Page 6 of 9 Active Problems ICD-10 Non-pressure chronic ulcer of other part of right lower  leg with fat layer exposed Disruption of external operation (surgical) wound, not elsewhere classified, initial encounter Basal cell carcinoma of skin, unspecified Chronic venous hypertension (idiopathic) with inflammation of right lower  extremity Patient presents with a 63-monthhistory of nonhealing ulcer to the right lower extremity status post basal cell carcinoma lesion removal and likely not healing in the setting of venous insufficiency. The area has granulation tissue throughout. No signs of infection. At this time I recommended Medihoney and Hydrofera Blue under compression therapy. She has strong pedal pulses T suggest adequate blood flow for healing. Will start with Kerlix/Coban and she wound likely o benefit from a 3 layer compression in the near future. She knows to not get the wrap wet or keep this on for more than 7 days. She can use a cast protector bag to shower. Follow-up in 1 week. Procedures Wound #1 Pre-procedure diagnosis of Wound #1 is an Open Surgical Wound located on the Right,Medial Lower Leg . There was a Chemical/Enzymatic/Mechanical debridement performed by DDeon Pilling RN.. Other agent used was gauze and wound cleanser. A Moderate amount of bleeding was controlled with N/A. The procedure was tolerated well. Post Debridement Measurements: 1.5cm length x 1.4cm width x 0.1cm depth; 0.165cm^3 volume. Character of Wound/Ulcer Post Debridement is stable. Post procedure Diagnosis Wound #1: Same as Pre-Procedure Plan Follow-up Appointments: Return Appointment in 1 week. - Dr. [place] [place] 01/30/2022 1115 Return Appointment in 2 weeks. - Dr. [place] [place] 1/22/224 1115 Return appointment in 3 weeks. - Dr. [place] [place] 1/29/224 0800 Anesthetic: (In clinic) Topical Lidocaine 4% applied to wound bed Bathing/ Shower/ Hygiene: May shower with protection but do not get wound dressing(s) wet. Protect dressing(s) with water repellant cover (for example, large plastic bag) or a cast cover and may then take shower. Edema Control - Lymphedema / SCD / Other: Elevate legs to the level of the heart or above for 30 minutes daily and/or when sitting for 3-4 times a day throughout the day. Avoid standing for long  periods of time. Exercise regularly The following medication(s) was prescribed: lidocaine topical 4 % cream cream topical once daily for debridements only was prescribed at facility WOUND #1: - Lower Leg Wound Laterality: Right, Medial Cleanser: Soap and Water 1 x Per Week/30 Days Discharge Instructions: May shower and wash wound with dial antibacterial soap and water prior to dressing change. Cleanser: Wound Cleanser 1 x Per Week/30 Days Discharge Instructions: Cleanse the wound with wound cleanser prior to applying a clean dressing using gauze sponges, not tissue or cotton balls. Peri-Wound Care: Sween Lotion (Moisturizing lotion) 1 x Per Week/30 Days Discharge Instructions: Apply moisturizing lotion as directed Prim Dressing: Hydrofera Blue Ready Transfer Foam, 4x5 (in/in) 1 x Per Week/30 Days ary Discharge Instructions: Apply over medihoney. Prim Dressing: MediHoney Gel, tube 1.5 (oz) 1 x Per Week/30 Days ary Discharge Instructions: Apply directly to wound bed. Secondary Dressing: ABD Pad, 8x10 1 x Per Week/30 Days Discharge Instructions: Apply over primary dressing as directed. Com pression Wrap: Kerlix Roll 4.5x3.1 (in/yd) 1 x Per Week/30 Days Discharge Instructions: Apply Kerlix and Coban compression as directed. Com pression Wrap: Coban Self-Adherent Wrap 4x5 (in/yd) 1 x Per Week/30 Days Discharge Instructions: Apply over Kerlix as directed. 1. Medihoney and Hydrofera Blue 2. Kerlix/Cobanooright lower extremity 3. Follow-up in 1 week Electronic Signature(s) Signed: 01/23/2022 12:33:43 PM By: HKalman ShanDO Entered By: HKalman Shanon 01/23/2022 09:27:44 Abel, SPearletha [name]([id number] Page 7 of 9 -------------------------------------------------------------------------------- HxROS Details Patient  Name: Date of Service: ALANTIS, BETHUNE 01/23/2022 8:00 A M Medical Record Number: [id number] Patient Account Number: [id number] Date  of Birth/Sex: Treating RN: 25-Apr-1965 (57 y.o. Tonita Phoenix, Lauren Primary Care Provider: Fanny Bien Other Clinician: Referring Provider: Treating Provider/Extender: Lenis Dickinson in Treatment: 0 Information Obtained From Patient Chart Constitutional Symptoms (General Health) Complaints and Symptoms: Negative for: Fatigue; Fever; Chills; Marked Weight Change Eyes Complaints and Symptoms: Negative for: Dry Eyes; Vision Changes; Glasses / Contacts Ear/Nose/Mouth/Throat Complaints and Symptoms: Negative for: Chronic sinus problems or rhinitis Respiratory Complaints and Symptoms: Negative for: Chronic or frequent coughs; Shortness of Breath Cardiovascular Complaints and Symptoms: Negative for: Chest pain Gastrointestinal Complaints and Symptoms: Negative for: Frequent diarrhea; Nausea; Vomiting Endocrine Complaints and Symptoms: Negative for: Heat/cold intolerance Genitourinary Complaints and Symptoms: Negative for: Frequent urination Integumentary (Skin) Complaints and Symptoms: Positive for: Wounds Musculoskeletal Complaints and Symptoms: Negative for: Muscle Pain; Muscle Weakness Neurologic Complaints and Symptoms: Negative for: Numbness/parasthesias Psychiatric Complaints and Symptoms: Negative for: MAHATI, VAJDA ([id number]) [phone].pdf Page 8 of 9 Hematologic/Lymphatic Immunological Oncologic Immunizations Implantable Devices None Hospitalization / Surgery History Type of Hospitalization/Surgery lap appe umbilical hernia repair C-section Family and Social History Unknown History: Yes; Never smoker; Marital Status - Married; Alcohol Use: Never; Drug Use: No History; Caffeine Use: Rarely; Financial Concerns: No; Food, Clothing or Shelter Needs: No; Support System Lacking: No; Transportation Concerns: No Electronic Signature(s) Signed: 01/23/2022 12:33:43 PM By: Kalman Shan  DO Signed: 01/23/2022 4:22:55 PM By: Rhae Hammock RN Entered By: Rhae Hammock on 01/20/2022 11:52:08 -------------------------------------------------------------------------------- SuperBill Details Patient Name: Date of Service: Shawnie Pons 01/23/2022 Medical Record Number: [id number] Patient Account Number: [id number] Date of Birth/Sex: Treating RN: [birth year] (58 y.o. Wanda [name] Primary Care Provider: Fanny Bien Other Clinician: Referring Provider: Treating Provider/Extender: Lenis Dickinson in Treatment: 0 Diagnosis Coding ICD-10 Codes Code Description [phone] Non-pressure chronic ulcer of other part of right lower leg with fat layer exposed T81.31XA Disruption of external operation (surgical) wound, not elsewhere classified, initial encounter C44.91 Basal cell carcinoma of skin, unspecified Facility Procedures : CPT4 Code: 70177939 Description: 99213 - WOUND CARE VISIT-LEV 3 EST PT Modifier: Quantity: 1 : CPT4 Code: 03009233 Description: 00762 - DEBRIDE W/O ANES NON SELECT Modifier: Quantity: 1 Physician Procedures : CPT4 Code Description Modifier [id number] - WC PHYS LEVEL 4 - NEW PT ICD-10 Diagnosis Description L89.373 Non-pressure chronic ulcer of other part of right lower leg with fat layer exposed T81.31XA Disruption of external operation (surgical) wound,  not elsewhere classified, initial encounter C44.91 Basal cell carcinoma of skin, unspecified Quantity: 1 Electronic Signature(s) [place], Wanda [name] ([id number]) [id number] Page 9 of 9 Signed: 01/23/2022 12:33:43 PM By: Kalman Shan DO Entered By: Kalman Shan on 01/23/2022 09:27:56

## 2022-01-24 NOTE — Progress Notes (Signed)
Wanda [name], Tinkle Wanda [name] ([id number]) [id number] Page 1 of 8 Visit Report for 01/23/2022 Allergy List Details Patient Name: Date of Service: Wanda [name], Wanda [name] 01/23/2022 8:00 A M Medical Record Number: [id number] Patient Account Number: [id number] Date of Birth/Sex: Treating RN: 1965-05-21 (57 y.o. Wanda [name] Primary Care Wanda [name]: Wanda [name] Other Clinician: Referring Wanda [name]: Treating Leahmarie Gasiorowski/Extender: Wanda [name] Weeks in Treatment: 0 Allergies Active Allergies No Known Allergies Allergy Notes Electronic Signature(s) Signed: 01/23/2022 4:22:55 PM By: Wanda Hammock RN Entered By: Wanda [name] on 01/20/2022 11:50:55 -------------------------------------------------------------------------------- Arrival Information Details Patient Name: Date of Service: Wanda [name]. 01/23/2022 8:00 A M Medical Record Number: [id number] Patient Account Number: [id number] Date of Birth/Sex: Treating RN: September 19, 1965 (57 y.o. [name]) Primary Care Wanda [name]: Wanda [name] Other Clinician: Referring Wanda [name]: Treating Wanda [name]/Extender: Wanda [name] in Treatment: 0 Visit Information Patient Arrived: Ambulatory Arrival Time: 08:17 Accompanied By: self Transfer Assistance: None Patient Identification Verified: Yes Secondary Verification Process Completed: Yes Electronic Signature(s) Signed: 01/23/2022 9:54:25 AM By: Worthy Rancher Entered By: Worthy Rancher on 01/23/2022 08:17:12 -------------------------------------------------------------------------------- Clinic Level of Care Assessment Details Patient Name: Date of Service: Wanda [name], Wanda [name] 01/23/2022 8:00 A M Medical Record Number: [id number] Patient Account Number: [id number] Date of Birth/Sex: Treating RN: 1965-05-08 (57 y.o. Wanda [name] Primary Care Wanda [name]: Wanda [name] Other Clinician: Shawnie [name] ([id number])  [id number] Page 2 of 8 Referring Lisvet Rasheed: Treating Wanda [name]/Extender: Wanda [name] in Treatment: 0 Clinic Level of Care Assessment Items TOOL 1 Quantity Score X- 1 0 Use when EandM and Procedure is performed on INITIAL visit ASSESSMENTS - Nursing Assessment / Reassessment X- 1 20 General Physical Exam (combine w/ comprehensive assessment (listed just below) when performed on new pt. evals) X- 1 25 Comprehensive Assessment (HX, ROS, Risk Assessments, Wounds Hx, etc.) ASSESSMENTS - Wound and Skin Assessment / Reassessment X- 1 10 Dermatologic / Skin Assessment (not related to wound area) ASSESSMENTS - Ostomy and/or Continence Assessment and Care '[]'$  - 0 Incontinence Assessment and Management '[]'$  - 0 Ostomy Care Assessment and Management (repouching, etc.) PROCESS - Coordination of Care X - Simple Patient / Family Education for ongoing care 1 15 '[]'$  - 0 Complex (extensive) Patient / Family Education for ongoing care X- 1 10 Staff obtains Programmer, systems, Records, T Results / Process Orders est '[]'$  - 0 Staff telephones HHA, Nursing Homes / Clarify orders / etc '[]'$  - 0 Routine Transfer to another Facility (non-emergent condition) '[]'$  - 0 Routine Hospital Admission (non-emergent condition) X- 1 15 New Admissions / Biomedical engineer / Ordering NPWT Apligraf, etc. , '[]'$  - 0 Emergency Hospital Admission (emergent condition) PROCESS - Special Needs '[]'$  - 0 Pediatric / Minor Patient Management '[]'$  - 0 Isolation Patient Management '[]'$  - 0 Hearing / Language / Visual special needs '[]'$  - 0 Assessment of Community assistance (transportation, D/C planning, etc.) '[]'$  - 0 Additional assistance / Altered mentation '[]'$  - 0 Support Surface(s) Assessment (bed, cushion, seat, etc.) INTERVENTIONS - Miscellaneous '[]'$  - 0 External ear exam '[]'$  - 0 Patient Transfer (multiple staff / Civil Service fast streamer / Similar devices) '[]'$  - 0 Simple Staple / Suture  removal (25 or less) '[]'$  - 0 Complex Staple / Suture removal (26 or more) '[]'$  - 0 Hypo/Hyperglycemic Management (do not check if billed separately) X- 1 15 Ankle / Brachial Index (ABI) - do not check if billed separately Has the patient been seen at the hospital within the last three years: Yes Total Score: 110  Level Of Care: New/Established - Level 3 Electronic Signature(s) Signed: 01/23/2022 6:29:44 PM By: Wanda Pilling RN, BSN Entered By: Wanda [name] on 01/23/2022 09:03:45 Lower Extremity Assessment Details -------------------------------------------------------------------------------- Wanda [name] ([id number]) [id number] Page 3 of 8 Patient Name: Date of Service: Wanda [name], Wanda [name] 01/23/2022 8:00 A M Medical Record Number: [id number] Patient Account Number: [id number] Date of Birth/Sex: Treating RN: [birth year] (57 y.o. Wanda [name] Primary Care Wanda [name]: Wanda [name] Other Clinician: Referring Wanda [name]: Treating Wanda [name]/Extender: Wanda [name] Weeks in Treatment: 0 Edema Assessment Left: Right: Assessed: No Yes Edema: Yes Calf Left: Right: Point of Measurement: 39 cm From Medial Instep 39.5 cm Ankle Left: Right: Point of Measurement: 9 cm From Medial Instep 23 cm Knee To Floor Left: Right: From Medial Instep 42 cm Vascular Assessment Left: Right: Pulses: Dorsalis Pedis Palpable: Yes Posterior Tibial Palpable: Yes Blood Pressure: Brachial: 106 Ankle: Dorsalis Pedis: 160 Ankle Brachial Index: 1.51 Electronic Signature(s) Signed: 01/23/2022 4:22:55 PM By: Wanda Hammock RN Entered By: Wanda [name] on 01/23/2022 08:27:48 -------------------------------------------------------------------------------- Multi Wound Chart Details Patient Name: Date of Service: Wanda [name]. 01/23/2022 8:00 A M Medical Record Number: [id number] Patient Account Number: [id number] Date of Birth/Sex: Treating  RN: 1965-05-13 (58 y.o. [name]) Primary Care Kajuana Shareef: Wanda [name] Other Clinician: Referring Zariyah Stephens: Treating Lucile Hillmann/Extender: Wanda [name] in Treatment: 0 Vital Signs Height(in): 66 Pulse(bpm): 76 Weight(lbs): 175 Blood Pressure(mmHg): 106/65 Body Mass Index(BMI): 28.2 Temperature([name]): 98.0 Respiratory Rate(breaths/min): 20 [1:Photos:] [N/A:N/A] Right, Medial Lower Leg N/A N/A Wound Location: Surgical Injury N/A N/A Wounding Event: Open Surgical Wound N/A N/A Primary Etiology: 11/17/2021 N/A N/A Date Acquired: 0 N/A N/A Weeks of Treatment: Open N/A N/A Wound Status: No N/A N/A Wound Recurrence: 1.5x1.4x0.1 N/A N/A Measurements L x W x D (cm) 1.649 N/A N/A A (cm) : rea 0.165 N/A N/A Volume (cm) : Full Thickness With Exposed Support N/A N/A Classification: Structures Medium N/A N/A Exudate A mount: Serosanguineous N/A N/A Exudate Type: red, brown N/A N/A Exudate Color: Distinct, outline attached N/A N/A Wound Margin: Large (67-100%) N/A N/A Granulation A mount: Red, Pink N/A N/A Granulation Quality: Small (1-33%) N/A N/A Necrotic A mount: Fat Layer (Subcutaneous Tissue): Yes N/A N/A Exposed Structures: Fascia: No Tendon: No Muscle: No Joint: No Bone: No None N/A N/A Epithelialization: Chemical/Enzymatic/Mechanical N/A N/A Debridement: N/A N/A N/A Instrument: Moderate N/A N/A Bleeding: Debridement Treatment Response: Procedure was tolerated well N/A N/A Post Debridement Measurements L x 1.5x1.4x0.1 N/A N/A W x D (cm) 0.165 N/A N/A Post Debridement Volume: (cm) Excoriation: No N/A N/A Periwound Skin Texture: Induration: No Callus: No Crepitus: No Rash: No Scarring: No Maceration: No N/A N/A Periwound Skin Moisture: Dry/Scaly: No Atrophie Blanche: No N/A N/A Periwound Skin Color: Cyanosis: No Ecchymosis: No Erythema: No Hemosiderin Staining: No Mottled: No Pallor: No Rubor: No No Abnormality  N/A N/A Temperature: Yes N/A N/A Tenderness on Palpation: Debridement N/A N/A Procedures Performed: Treatment Notes Electronic Signature(s) Signed: 01/23/2022 12:33:43 PM By: Kalman Shan DO Entered By: Kalman Shan on 01/23/2022 09:09:39 -------------------------------------------------------------------------------- Multi-Disciplinary Care Plan Details Patient Name: Date of Service: Wanda [name]. 01/23/2022 8:00 A M Medical Record Number: [id number] Patient Account Number: [id number] Date of Birth/Sex: Treating RN: Feb 23, 1965 (57 y.o. Wanda [name], Wanda [name] Primary Care [place] Seaborn: Wanda [name] Other Clinician: Referring Lynnae Ludemann: Treating Nollan Muldrow/Extender: Wanda [name] in Treatment: [address], Wanda [name] ([id number]) [id number] Page 5 of 8 Orientation to the Wound Care Program Nursing Diagnoses: Knowledge deficit  related to the wound healing center program Goals: Patient/caregiver will verbalize understanding of the [place] Date Initiated: 01/23/2022 Target Resolution Date: 02/24/2022 Goal Status: Active Interventions: Provide education on orientation to the wound center Notes: Wound/Skin Impairment Nursing Diagnoses: Knowledge deficit related to ulceration/compromised skin integrity Goals: Patient/caregiver will verbalize understanding of skin care regimen Date Initiated: 01/23/2022 Target Resolution Date: 02/23/2022 Goal Status: Active Interventions: Assess patient/caregiver ability to perform ulcer/skin care regimen upon admission and as needed Assess ulceration(s) every visit Provide education on ulcer and skin care Treatment Activities: Skin care regimen initiated : 01/23/2022 Topical wound management initiated : 01/23/2022 Notes: Electronic Signature(s) Signed: 01/23/2022 6:29:44 PM By: Wanda Pilling RN, BSN Entered By: Wanda [name] on 01/23/2022  08:39:44 -------------------------------------------------------------------------------- Pain Assessment Details Patient Name: Date of Service: Wanda [name]. 01/23/2022 8:00 [place] Record Number: [id number] Patient Account Number: [id number] Date of Birth/Sex: Treating RN: 1965/07/05 (57 y.o. [name]) Primary Care Sanjna Haskew: Wanda [name] Other Clinician: Referring Kenidee Cregan: Treating Aryana Wonnacott/Extender: Wanda [name] in Treatment: 0 Active Problems Location of Pain Severity and Description of Pain Patient Has Paino Yes Site Locations Rate the pain. Wanda [name], Wanda [name] [place] [name] ([id number]) [id number] Page 6 of 8 Rate the pain. Current Pain Level: 6 Worst Pain Level: 10 Least Pain Level: 0 Tolerable Pain Level: 2 Character of Pain Describe the Pain: Shooting, Stabbing, Throbbing Pain Management and Medication Current Pain Management: Electronic Signature(s) Signed: 01/23/2022 9:54:25 AM By: Worthy Rancher Entered By: Worthy Rancher on 01/23/2022 08:18:37 -------------------------------------------------------------------------------- Patient/Caregiver Education Details Patient Name: Date of Service: Wanda [name] 1/8/2024andnbsp8:00 [place] Record Number: [id number] Patient Account Number: [id number] Date of Birth/Gender: Treating RN: 09-01-1965 (57 y.o. Wanda [name] Primary Care Physician: Wanda [name] Other Clinician: Referring Physician: Treating Physician/Extender: Wanda [name] in Treatment: 0 Education Assessment Education Provided To: Patient Education Topics Provided Wound/Skin Impairment: Handouts: Caring for Your Ulcer Methods: Explain/Verbal Electronic Signature(s) Signed: 01/23/2022 6:29:44 PM By: Wanda Pilling RN, BSN Entered By: Wanda [name] on 01/23/2022 08:55:34 -------------------------------------------------------------------------------- Wound Assessment  Details Patient Name: Date of Service: Wanda [name]. 01/23/2022 8:00 A M Medical Record Number: [id number] Patient Account Number: [id number] Date of Birth/Sex: Treating RN: 29-Dec-1965 (57 y.o. Wanda [name], Wanda [name], Wanda [name] ([id number]) [id number] Page 7 of 8 Primary Care Nealie Mchatton: Wanda [name] Other Clinician: Referring Cherise Fedder: Treating Glenyce Randle/Extender: Wanda [name] Weeks in Treatment: 0 Wound Status Wound Number: 1 Primary Etiology: Open Surgical Wound Wound Location: Right, Medial Lower Leg Wound Status: Open Wounding Event: Surgical Injury Date Acquired: 11/17/2021 Weeks Of Treatment: 0 Clustered Wound: No Photos Wound Measurements Length: (cm) 1.5 Width: (cm) 1.4 Depth: (cm) 0.1 Area: (cm) 1.649 Volume: (cm) 0.165 % Reduction in Area: % Reduction in Volume: Epithelialization: None Tunneling: No Undermining: No Wound Description Classification: Full Thickness With Exposed Suppo Wound Margin: Distinct, outline attached Exudate Amount: Medium Exudate Type: Serosanguineous Exudate Color: red, brown rt Structures Foul Odor After Cleansing: No Slough/Fibrino Yes Wound Bed Granulation Amount: Large (67-100%) Exposed Structure Granulation Quality: Red, Pink Fascia Exposed: No Necrotic Amount: Small (1-33%) Fat Layer (Subcutaneous Tissue) Exposed: Yes Necrotic Quality: Adherent Slough Tendon Exposed: No Muscle Exposed: No Joint Exposed: No Bone Exposed: No Periwound Skin Texture Texture Color No Abnormalities Noted: No No Abnormalities Noted: No Callus: No Atrophie Blanche: No Crepitus: No Cyanosis: No Excoriation: No Ecchymosis: No Induration: No Erythema: No Rash: No Hemosiderin Staining: No Scarring: No Mottled: No Pallor: No Moisture Rubor: No No  Abnormalities Noted: No Dry / Scaly: No Temperature / Pain Maceration: No Temperature: No Abnormality Tenderness on Palpation:  Yes Electronic Signature(s) Signed: 01/23/2022 4:22:55 PM By: Wanda Hammock RN Signed: 01/23/2022 6:29:44 PM By: Wanda Pilling RN, BSN Entered By: Wanda [name] on 01/23/2022 08:29:21 [place], Wanda [name] ([id number]) [id number] Page 8 of 8 -------------------------------------------------------------------------------- Vitals Details Patient Name: Date of Service: KEIMYA, BRIDDELL 01/23/2022 8:00 A M Medical Record Number: [id number] Patient Account Number: [id number] Date of Birth/Sex: Treating RN: 12-20-1965 (57 y.o. [name]) Primary Care Aldyn Toon: Wanda [name] Other Clinician: Referring Basia Mcginty: Treating Raiford Fetterman/Extender: Wanda [name] in Treatment: 0 Vital Signs Time Taken: 08:15 Temperature ([name]): 98.0 Height (in): 66 Pulse (bpm): 76 Weight (lbs): 175 Respiratory Rate (breaths/min): 20 Body Mass Index (BMI): 28.2 Blood Pressure (mmHg): 106/65 Reference Range: 80 - 120 mg / dl Electronic Signature(s) Signed: 01/23/2022 9:54:25 AM By: Worthy Rancher Entered By: Worthy Rancher on 01/23/2022 08:17:57

## 2022-01-30 ENCOUNTER — Encounter (HOSPITAL_BASED_OUTPATIENT_CLINIC_OR_DEPARTMENT_OTHER): Payer: No Typology Code available for payment source | Admitting: Internal Medicine

## 2022-01-30 DIAGNOSIS — L97812 Non-pressure chronic ulcer of other part of right lower leg with fat layer exposed: Secondary | ICD-10-CM | POA: Diagnosis not present

## 2022-02-06 ENCOUNTER — Encounter (HOSPITAL_BASED_OUTPATIENT_CLINIC_OR_DEPARTMENT_OTHER): Payer: No Typology Code available for payment source | Admitting: Internal Medicine

## 2022-02-06 DIAGNOSIS — L97812 Non-pressure chronic ulcer of other part of right lower leg with fat layer exposed: Secondary | ICD-10-CM

## 2022-02-06 DIAGNOSIS — C4491 Basal cell carcinoma of skin, unspecified: Secondary | ICD-10-CM | POA: Diagnosis not present

## 2022-02-06 DIAGNOSIS — I87321 Chronic venous hypertension (idiopathic) with inflammation of right lower extremity: Secondary | ICD-10-CM

## 2022-02-06 DIAGNOSIS — T8131XA Disruption of external operation (surgical) wound, not elsewhere classified, initial encounter: Secondary | ICD-10-CM | POA: Diagnosis not present

## 2022-02-06 NOTE — Progress Notes (Signed)
JREAM, BROYLES ([id number]) [id number] Page 1 of 6 Visit Report for 02/06/2022 Chief Complaint Document Details Patient Name: Date of Service: Wanda [name] 02/06/2022 11:15 [name] M Medical Record Number: [id number] Patient Account Number: [id number] Date of Birth/Sex: Treating RN: [birth year] (57 y.o. F) Primary Care Provider: Fanny Bien Other Clinician: Referring Provider: Treating Provider/Extender: Lenis Dickinson in Treatment: 2 Information Obtained from: Patient Chief Complaint 01/23/2022; right lower extremity wound s/p basal cell carcinoma removal Electronic Signature(s) Signed: 02/06/2022 12:02:25 PM By: Kalman Shan DO Entered By: Kalman Shan on 02/06/2022 11:58:35 -------------------------------------------------------------------------------- HPI Details Patient Name: Date of Service: Wanda Kilts F. 02/06/2022 11:15 [name] M Medical Record Number: [id number] Patient Account Number: [id number] Date of Birth/Sex: Treating RN: 09-21-1965 (57 y.o. F) Primary Care Provider: Fanny Bien Other Clinician: Referring Provider: Treating Provider/Extender: Lenis Dickinson in Treatment: 2 History of Present Illness HPI Description: 01/23/2022 Ms. Wanda [name] 57 year old female with [name] past medical history of basal cell carcinoma to the right lower extremity status post removal of lesion on 11/2 by Dr. Dennison Mascot. She had sutures in place however the area dehisced postsurgery There was concern for possible infection and she was on doxycycline. She was referred to Dr. Iran Planas for slow healing wound. She has been using collagen to the area. Currently she denies signs of infection. She states there has been improvement in wound healing albeit slow. 1/15; so patient who had [name] basal cell carcinoma extracted I believe in early November. Using Medihoney and Hydrofera Blue. Arrives in  clinic with [name] lot of the dressing sticking to the wound surface. Difficult to remove. By description of the patient this was [name] superficial basal cell 1/22; patient presents for follow-up. We have been using Hydrofera Blue under Kerlix/Coban to the right lower extremity. Patient's wound is almost healed. Electronic Signature(s) Signed: 02/06/2022 12:02:25 PM By: Kalman Shan DO Entered By: Kalman Shan on 02/06/2022 11:59:02 -------------------------------------------------------------------------------- Physical Exam Details Patient Name: Date of Service: Wanda [name], Wanda [name] 02/06/2022 11:15 [name] [name], Wanda [name] ([id number]) [id number] Page 2 of 6 Medical Record Number: [id number] Patient Account Number: [id number] Date of Birth/Sex: Treating RN: 05-28-1965 (57 y.o. F) Primary Care Provider: Fanny Bien Other Clinician: Referring Provider: Treating Provider/Extender: Lenis Dickinson in Treatment: 2 Constitutional respirations regular, non-labored and within target range for patient.. Cardiovascular 2+ dorsalis pedis/posterior tibialis pulses. Psychiatric pleasant and cooperative. Notes T the right medial lower leg there is [name] very small open wound. Devitalized tissue that was easily removed by hand. Epithelization circumferentially to the o edges. Good edema control. No signs of infection. Electronic Signature(s) Signed: 02/06/2022 12:02:25 PM By: Kalman Shan DO Entered By: Kalman Shan on 02/06/2022 12:00:15 -------------------------------------------------------------------------------- Physician Orders Details Patient Name: Date of Service: Wanda Kilts F. 02/06/2022 11:15 [name] M Medical Record Number: [id number] Patient Account Number: [id number] Date of Birth/Sex: Treating RN: 1965-01-22 (57 y.o. [name] Primary Care Provider: Fanny Bien Other Clinician: Referring Provider: Treating  Provider/Extender: Lenis Dickinson in Treatment: 2 Verbal / Phone Orders: No Diagnosis Coding ICD-10 Coding Code Description [phone] Non-pressure chronic ulcer of other part of right lower leg with fat layer exposed T81.31XA Disruption of external operation (surgical) wound, not elsewhere classified, initial encounter C44.91 Basal cell carcinoma of skin, unspecified I87.321 Chronic venous hypertension (idiopathic) with inflammation of right lower extremity Follow-up Appointments ppointment in 1 week. - Dr. Heber [place] Room 8 02/13/2022 0800 Return [name] Anesthetic (  In clinic) Topical Lidocaine 4% applied to wound bed Bathing/ Shower/ Hygiene May shower with protection but do not get wound dressing(s) wet. Protect dressing(s) with water repellant cover (for example, large plastic bag) or [name] cast cover and may then take shower. Edema Control - Lymphedema / SCD / Other Elevate legs to the level of the heart or above for 30 minutes daily and/or when sitting for 3-4 times [name] day throughout the day. Avoid standing for long periods of time. Exercise regularly Wound Treatment Wound #1 - Lower Leg Wound Laterality: Right, Medial Cleanser: Soap and Water 1 x Per Week/30 Days Discharge Instructions: May shower and wash wound with dial antibacterial soap and water prior to dressing change. Cleanser: Wound Cleanser 1 x Per Week/30 Days Discharge Instructions: Cleanse the wound with wound cleanser prior to applying [name] clean dressing using gauze sponges, not tissue or cotton balls. Peri-Wound Care: Sween Lotion (Moisturizing lotion) 1 x Per Week/30 Days Wanda [name], Wanda [name] ([id number]) [id number] Page 3 of 6 Discharge Instructions: Apply moisturizing lotion as directed Prim Dressing: Xeroform Occlusive Gauze Dressing, 4x4 in 1 x Per Week/30 Days ary Discharge Instructions: Apply to wound bed as instructed Secondary Dressing: Woven Gauze Sponge, Non-Sterile  4x4 in 1 x Per Week/30 Days Discharge Instructions: Apply over primary dressing as directed. Compression Wrap: Kerlix Roll 4.5x3.1 (in/yd) 1 x Per Week/30 Days Discharge Instructions: Apply Kerlix and Coban compression as directed. Compression Wrap: Coban Self-Adherent Wrap 4x5 (in/yd) 1 x Per Week/30 Days Discharge Instructions: Apply over Kerlix as directed. Electronic Signature(s) Signed: 02/06/2022 12:02:25 PM By: Kalman Shan DO Entered By: Kalman Shan on 02/06/2022 12:01:14 -------------------------------------------------------------------------------- Problem List Details Patient Name: Date of Service: Wanda Kilts F. 02/06/2022 11:15 [name] M Medical Record Number: [id number] Patient Account Number: [id number] Date of Birth/Sex: Treating RN: [birth year] (57 y.o. [name] Primary Care Provider: Fanny Bien Other Clinician: Referring Provider: Treating Provider/Extender: Lenis Dickinson in Treatment: 2 Active Problems ICD-10 Encounter Code Description Active Date MDM Diagnosis [phone] Non-pressure chronic ulcer of other part of right lower leg with fat layer 01/23/2022 No Yes exposed T81.31XA Disruption of external operation (surgical) wound, not elsewhere classified, 01/23/2022 No Yes initial encounter C44.91 Basal cell carcinoma of skin, unspecified 01/23/2022 No Yes I87.321 Chronic venous hypertension (idiopathic) with inflammation of right lower 01/23/2022 No Yes extremity Inactive Problems Resolved Problems Electronic Signature(s) Signed: 02/06/2022 12:02:25 PM By: Kalman Shan DO Entered By: Kalman Shan on 02/06/2022 11:58:22 Wanda [name], Wanda [name] ([id number]) [id number] Page 4 of 6 -------------------------------------------------------------------------------- Progress Note Details Patient Name: Date of Service: Wanda [name], Wanda [name] 02/06/2022 11:15 [name] M Medical Record Number: [id number] Patient  Account Number: [id number] Date of Birth/Sex: Treating RN: 1965-02-08 (57 y.o. F) Primary Care Provider: Fanny Bien Other Clinician: Referring Provider: Treating Provider/Extender: Lenis Dickinson in Treatment: 2 Subjective Chief Complaint Information obtained from Patient 01/23/2022; right lower extremity wound s/p basal cell carcinoma removal History of Present Illness (HPI) 01/23/2022 Ms. Wanda [name] Is [name] 57 year old female with [name] past medical history of basal cell carcinoma to the right lower extremity status post removal of lesion on 11/2 by Dr. Dennison Mascot. She had sutures in place however the area dehisced postsurgery There was concern for possible infection and she was on doxycycline. She was referred to Dr. Iran Planas for slow healing wound. She has been using collagen to the area. Currently she denies signs of infection. She states there has been improvement in wound healing albeit slow. 1/15; so patient  who had [name] basal cell carcinoma extracted I believe in early November. Using Medihoney and Hydrofera Blue. Arrives in clinic with [name] lot of the dressing sticking to the wound surface. Difficult to remove. By description of the patient this was [name] superficial basal cell 1/22; patient presents for follow-up. We have been using Hydrofera Blue under Kerlix/Coban to the right lower extremity. Patient's wound is almost healed. Patient History Information obtained from Patient, Chart. Family History Unknown History. Social History Never smoker, Marital Status - Married, Alcohol Use - Never, Drug Use - No History, Caffeine Use - Rarely. Medical History Hospitalization/Surgery History - lap appe. - umbilical hernia repair. - C-section. Objective Constitutional respirations regular, non-labored and within target range for patient.. Vitals Time Taken: 11:29 AM, Height: 66 in, Weight: 175 lbs, BMI: 28.2, Temperature: 97.4 F, Pulse: 67 bpm, Respiratory Rate: 20  breaths/min, Blood Pressure: 113/72 mmHg. Cardiovascular 2+ dorsalis pedis/posterior tibialis pulses. Psychiatric pleasant and cooperative. General Notes: T the right medial lower leg there is [name] very small open wound. Devitalized tissue that was easily removed by hand. Epithelization o circumferentially to the edges. Good edema control. No signs of infection. Integumentary (Hair, Skin) Wound #1 status is Open. Original cause of wound was Surgical Injury. The date acquired was: 11/17/2021. The wound has been in treatment 2 weeks. The wound is located on the Right,Medial Lower Leg. The wound measures 0.6cm length x 0.4cm width x 0.1cm depth; 0.188cm^2 area and 0.019cm^3 volume. There is Fat Layer (Subcutaneous Tissue) exposed. There is no tunneling or undermining noted. There is [name] medium amount of serosanguineous drainage noted. The wound margin is distinct with the outline attached to the wound base. There is large (67-100%) red, pink granulation within the wound bed. There is no necrotic tissue within the wound bed. The periwound skin appearance did not exhibit: Callus, Crepitus, Excoriation, Induration, Rash, Scarring, Dry/Scaly, Maceration, Atrophie Blanche, Cyanosis, Ecchymosis, Hemosiderin Staining, Mottled, Pallor, Rubor, Erythema. Periwound temperature was noted as No Abnormality. The periwound has tenderness on palpation. Wanda [name], Wanda [name] ([id number]) [id number] Page 5 of 6 Assessment Active Problems ICD-10 Non-pressure chronic ulcer of other part of right lower leg with fat layer exposed Disruption of external operation (surgical) wound, not elsewhere classified, initial encounter Basal cell carcinoma of skin, unspecified Chronic venous hypertension (idiopathic) with inflammation of right lower extremity Patient's wound has shown improvement in size in appearance since last clinic visit. At this time I recommended Xeroform as her wound is almost  completely healed in 1 more week of compression wrap. Plan Follow-up Appointments: Return Appointment in 1 week. - Dr. Heber [place] Room 8 02/13/2022 0800 Anesthetic: (In clinic) Topical Lidocaine 4% applied to wound bed Bathing/ Shower/ Hygiene: May shower with protection but do not get wound dressing(s) wet. Protect dressing(s) with water repellant cover (for example, large plastic bag) or [name] cast cover and may then take shower. Edema Control - Lymphedema / SCD / Other: Elevate legs to the level of the heart or above for 30 minutes daily and/or when sitting for 3-4 times [name] day throughout the day. Avoid standing for long periods of time. Exercise regularly WOUND #1: - Lower Leg Wound Laterality: Right, Medial Cleanser: Soap and Water 1 x Per Week/30 Days Discharge Instructions: May shower and wash wound with dial antibacterial soap and water prior to dressing change. Cleanser: Wound Cleanser 1 x Per Week/30 Days Discharge Instructions: Cleanse the wound with wound cleanser prior to applying [name] clean dressing using gauze sponges, not tissue or cotton balls. Peri-Wound  Care: Sween Lotion (Moisturizing lotion) 1 x Per Week/30 Days Discharge Instructions: Apply moisturizing lotion as directed Prim Dressing: Xeroform Occlusive Gauze Dressing, 4x4 in 1 x Per Week/30 Days ary Discharge Instructions: Apply to wound bed as instructed Secondary Dressing: Woven Gauze Sponge, Non-Sterile 4x4 in 1 x Per Week/30 Days Discharge Instructions: Apply over primary dressing as directed. Com pression Wrap: Kerlix Roll 4.5x3.1 (in/yd) 1 x Per Week/30 Days Discharge Instructions: Apply Kerlix and Coban compression as directed. Com pression Wrap: Coban Self-Adherent Wrap 4x5 (in/yd) 1 x Per Week/30 Days Discharge Instructions: Apply over Kerlix as directed. 1. Xeroform under Kerlix/Cobanooright lower extremity 2. Follow-up in 1 week Electronic Signature(s) Signed: 02/06/2022 12:02:25 PM By: Kalman Shan  DO Entered By: Kalman Shan on 02/06/2022 12:01:57 -------------------------------------------------------------------------------- HxROS Details Patient Name: Date of Service: Wanda Kilts F. 02/06/2022 11:15 [name] M Medical Record Number: [id number] Patient Account Number: [id number] Date of Birth/Sex: Treating RN: April 04, 1965 (57 y.o. F) Primary Care Provider: Fanny Bien Other Clinician: Referring Provider: Treating Provider/Extender: Lenis Dickinson in Treatment: 2 Information Obtained From Patient Chart Immunizations Wanda [name], Wanda [name] ([id number]) [id number] Page 6 of 6 Implantable Devices None Hospitalization / Surgery History Type of Hospitalization/Surgery lap appe umbilical hernia repair C-section Family and Social History Unknown History: Yes; Never smoker; Marital Status - Married; Alcohol Use: Never; Drug Use: No History; Caffeine Use: Rarely; Financial Concerns: No; Food, Clothing or Shelter Needs: No; Support System Lacking: No; Transportation Concerns: No Electronic Signature(s) Signed: 02/06/2022 12:02:25 PM By: Kalman Shan DO Entered By: Kalman Shan on 02/06/2022 11:59:10 -------------------------------------------------------------------------------- SuperBill Details Patient Name: Date of Service: Wanda [name] 02/06/2022 Medical Record Number: [id number] Patient Account Number: [id number] Date of Birth/Sex: Treating RN: November 04, 1965 (57 y.o. [name] Primary Care Provider: Fanny Bien Other Clinician: Referring Provider: Treating Provider/Extender: Lenis Dickinson in Treatment: 2 Diagnosis Coding ICD-10 Codes Code Description [phone] Non-pressure chronic ulcer of other part of right lower leg with fat layer exposed T81.31XA Disruption of external operation (surgical) wound, not elsewhere classified, initial encounter C44.91 Basal cell  carcinoma of skin, unspecified I87.321 Chronic venous hypertension (idiopathic) with inflammation of right lower extremity Facility Procedures : CPT4 Code: [id number] Description: 99213 - WOUND CARE VISIT-LEV 3 EST PT Modifier: Quantity: 1 Physician Procedures : CPT4 Code Description Modifier [id number] - WC PHYS LEVEL 3 - EST PT ICD-10 Diagnosis Description Z70.964 Non-pressure chronic ulcer of other part of right lower leg with fat layer exposed T81.31XA Disruption of external operation (surgical) wound,  not elsewhere classified, initial encounter C44.91 Basal cell carcinoma of skin, unspecified I87.321 Chronic venous hypertension (idiopathic) with inflammation of right lower extremity Quantity: 1 Electronic Signature(s) Signed: 02/06/2022 12:02:25 PM By: Kalman Shan DO Entered By: Kalman Shan on 02/06/2022 12:02:08

## 2022-02-06 NOTE — Progress Notes (Signed)
Wanda, [name] ([id number]) [id number] Page 1 of 9 Visit Report for 02/06/2022 Arrival Information Details Patient Name: Date of Service: Wanda [name], Wanda [name] 02/06/2022 11:15 A M Medical Record Number: [id number] Patient Account Number: [id number] Date of Birth/Sex: Treating RN: July 28, 1965 (57 y.o. F) Primary Care Shawneequa Baldridge: Fanny Bien Other Clinician: Referring Kelsie Kramp: Treating Bobbye Reinitz/Extender: Lenis Dickinson in Treatment: 2 Visit Information History Since Last Visit All ordered tests and consults were completed: No Patient Arrived: Ambulatory Added or deleted any medications: No Arrival Time: 11:29 Any new allergies or adverse reactions: No Accompanied By: self Had a fall or experienced change in No Transfer Assistance: None activities of daily living that may affect Patient Identification Verified: Yes risk of falls: Secondary Verification Process Completed: Yes Signs or symptoms of abuse/neglect since last visito No Patient Requires Transmission-Based Precautions: No Hospitalized since last visit: No Patient Has Alerts: No Implantable device outside of the clinic excluding No cellular tissue based products placed in the center since last visit: Pain Present Now: No Electronic Signature(s) Signed: 02/06/2022 11:37:02 AM By: Worthy Rancher Entered By: Worthy Rancher on 02/06/2022 11:29:28 -------------------------------------------------------------------------------- Clinic Level of Care Assessment Details Patient Name: Date of Service: Wanda [name], Wanda [name] 02/06/2022 11:15 A M Medical Record Number: [id number] Patient Account Number: [id number] Date of Birth/Sex: Treating RN: 1965-02-07 (57 y.o. [name] Primary Care Ivelis Norgard: Fanny Bien Other Clinician: Referring Gracielynn Birkel: Treating Malvern Kadlec/Extender: Lenis Dickinson in Treatment: 2 Clinic Level of Care Assessment  Items TOOL 4 Quantity Score X- 1 0 Use when only an EandM is performed on FOLLOW-UP visit ASSESSMENTS - Nursing Assessment / Reassessment X- 1 10 Reassessment of Co-morbidities (includes updates in patient status) X- 1 5 Reassessment of Adherence to Treatment Plan ASSESSMENTS - Wound and Skin A ssessment / Reassessment '[]'$  - 0 Simple Wound Assessment / Reassessment - one wound X- 1 5 Complex Wound Assessment / Reassessment - multiple wounds '[]'$  - 0 Dermatologic / Skin Assessment (not related to wound area) ASSESSMENTS - Focused Assessment '[]'$  - 0 Circumferential Edema Measurements - multi extremities '[]'$  - 0 Nutritional Assessment / Counseling / Intervention Wanda, [name] ([id number]) [id number] Page 2 of 9 '[]'$  - 0 Lower Extremity Assessment (monofilament, tuning fork, pulses) '[]'$  - 0 Peripheral Arterial Disease Assessment (using hand held doppler) ASSESSMENTS - Ostomy and/or Continence Assessment and Care '[]'$  - 0 Incontinence Assessment and Management '[]'$  - 0 Ostomy Care Assessment and Management (repouching, etc.) PROCESS - Coordination of Care X - Simple Patient / Family Education for ongoing care 1 15 '[]'$  - 0 Complex (extensive) Patient / Family Education for ongoing care X- 1 10 Staff obtains Programmer, systems, Records, T Results / Process Orders est '[]'$  - 0 Staff telephones HHA, Nursing Homes / Clarify orders / etc '[]'$  - 0 Routine Transfer to another Facility (non-emergent condition) '[]'$  - 0 Routine Hospital Admission (non-emergent condition) '[]'$  - 0 New Admissions / Biomedical engineer / Ordering NPWT Apligraf, etc. , '[]'$  - 0 Emergency Hospital Admission (emergent condition) X- 1 10 Simple Discharge Coordination '[]'$  - 0 Complex (extensive) Discharge Coordination PROCESS - Special Needs '[]'$  - 0 Pediatric / Minor Patient Management '[]'$  - 0 Isolation Patient Management '[]'$  - 0 Hearing / Language / Visual special needs '[]'$  - 0 Assessment of  Community assistance (transportation, D/C planning, etc.) '[]'$  - 0 Additional assistance / Altered mentation '[]'$  - 0 Support Surface(s) Assessment (bed, cushion, seat, etc.) INTERVENTIONS - Wound Cleansing / Measurement X - Simple Wound Cleansing -  one wound 1 5 '[]'$  - 0 Complex Wound Cleansing - multiple wounds X- 1 5 Wound Imaging (photographs - any number of wounds) '[]'$  - 0 Wound Tracing (instead of photographs) X- 1 5 Simple Wound Measurement - one wound '[]'$  - 0 Complex Wound Measurement - multiple wounds INTERVENTIONS - Wound Dressings '[]'$  - 0 Small Wound Dressing one or multiple wounds '[]'$  - 0 Medium Wound Dressing one or multiple wounds X- 1 20 Large Wound Dressing one or multiple wounds '[]'$  - 0 Application of Medications - topical '[]'$  - 0 Application of Medications - injection INTERVENTIONS - Miscellaneous '[]'$  - 0 External ear exam '[]'$  - 0 Specimen Collection (cultures, biopsies, blood, body fluids, etc.) '[]'$  - 0 Specimen(s) / Culture(s) sent or taken to Lab for analysis '[]'$  - 0 Patient Transfer (multiple staff / Civil Service fast streamer / Similar devices) '[]'$  - 0 Simple Staple / Suture removal (25 or less) '[]'$  - 0 Complex Staple / Suture removal (26 or more) '[]'$  - 0 Hypo / Hyperglycemic Management (close monitor of Blood Glucose) Day, Pearletha Forge ([id number]) [id number] Page 3 of 9 '[]'$  - 0 Ankle / Brachial Index (ABI) - do not check if billed separately X- 1 5 Vital Signs Has the patient been seen at the hospital within the last three years: Yes Total Score: 95 Level Of Care: New/Established - Level 3 Electronic Signature(s) Signed: 02/07/2022 6:38:55 PM By: Deon Pilling RN, BSN Entered By: Deon Pilling on 02/06/2022 11:58:04 -------------------------------------------------------------------------------- Encounter Discharge Information Details Patient Name: Date of Service: Wanda Kilts F. 02/06/2022 11:15 A M Medical Record Number:  [id number] Patient Account Number: [id number] Date of Birth/Sex: Treating RN: Jul 09, 1965 (57 y.o. Helene Shoe, Tammi Klippel Primary Care Yanci Bachtell: Fanny Bien Other Clinician: Referring Valentino Saavedra: Treating Serafina Topham/Extender: Lenis Dickinson in Treatment: 2 Encounter Discharge Information Items Discharge Condition: Stable Ambulatory Status: Ambulatory Discharge Destination: Home Transportation: Private Auto Accompanied By: self Schedule Follow-up Appointment: Yes Clinical Summary of Care: Electronic Signature(s) Signed: 02/07/2022 6:38:55 PM By: Deon Pilling RN, BSN Entered By: Deon Pilling on 02/06/2022 11:58:26 -------------------------------------------------------------------------------- Lower Extremity Assessment Details Patient Name: Date of Service: Wanda [name], Wanda [name] 02/06/2022 11:15 A M Medical Record Number: [id number] Patient Account Number: [id number] Date of Birth/Sex: Treating RN: 1965-08-17 (57 y.o. Helene Shoe, Meta.Reding Primary Care Senia Even: Fanny Bien Other Clinician: Referring Adeel Guiffre: Treating Zayon Trulson/Extender: Kirby Funk Weeks in Treatment: 2 Edema Assessment Assessed: [Left: No] [Right: No] Edema: [Left: Ye] [Right: s] Calf Left: Right: Point of Measurement: 39 cm From Medial Instep 40 cm Ankle Left: Right: Point of Measurement: 9 cm From Medial Instep 23.5 cm Electronic Signature(s) Signed: 02/07/2022 6:38:55 PM By: Deon Pilling RN, BSN Gadbois, [place] F ([id number]) By: Deon Pilling RN, BSN [phone].pdf Page 4 of 9 Signed: 02/07/2022 6:38:55 PM Entered By: Deon Pilling on 02/06/2022 11:55:48 -------------------------------------------------------------------------------- Multi Wound Chart Details Patient Name: Date of Service: Wanda [name], Wanda [name] 02/06/2022 11:15 A M Medical Record Number: [id number] Patient Account Number: [id number] Date of Birth/Sex: Treating  RN: 1966-01-16 (57 y.o. F) Primary Care Julissa Browning: Fanny Bien Other Clinician: Referring Naomie Crow: Treating Teshara Moree/Extender: Lenis Dickinson in Treatment: 2 Vital Signs Height(in): 66 Pulse(bpm): 1 Weight(lbs): 175 Blood Pressure(mmHg): 113/72 Body Mass Index(BMI): 28.2 Temperature(F): 97.4 Respiratory Rate(breaths/min): 20 [1:Photos:] [N/A:N/A] Right, Medial Lower Leg N/A N/A Wound Location: Surgical Injury N/A N/A Wounding Event: Open Surgical Wound N/A N/A Primary Etiology: 11/17/2021 N/A N/A Date Acquired: 2 N/A N/A Weeks of Treatment: Open N/A N/A Wound Status:  No N/A N/A Wound Recurrence: 0.6x0.4x0.1 N/A N/A Measurements L x W x D (cm) 0.188 N/A N/A A (cm) : rea 0.019 N/A N/A Volume (cm) : 88.60% N/A N/A % Reduction in A rea: 88.50% N/A N/A % Reduction in Volume: Full Thickness With Exposed Support N/A N/A Classification: Structures Medium N/A N/A Exudate Amount: Serosanguineous N/A N/A Exudate Type: red, brown N/A N/A Exudate Color: Distinct, outline attached N/A N/A Wound Margin: Large (67-100%) N/A N/A Granulation Amount: Red, Pink N/A N/A Granulation Quality: None Present (0%) N/A N/A Necrotic Amount: Fat Layer (Subcutaneous Tissue): Yes N/A N/A Exposed Structures: Fascia: No Tendon: No Muscle: No Joint: No Bone: No Large (67-100%) N/A N/A Epithelialization: Excoriation: No N/A N/A Periwound Skin Texture: Induration: No Callus: No Crepitus: No Rash: No Scarring: No Maceration: No N/A N/A Periwound Skin Moisture: Dry/Scaly: No Atrophie Blanche: No N/A N/A Periwound Skin Color: Cyanosis: No Ecchymosis: No Erythema: No Hemosiderin Staining: No Mottled: No Pallor: No LILLYANNE, BRADBURN ([id number]) [id number] Page 5 of 9 Rubor: No No Abnormality N/A N/A Temperature: Yes N/A N/A Tenderness on Palpation: Treatment Notes Wound #1 (Lower Leg) Wound Laterality:  Right, Medial Cleanser Soap and Water Discharge Instruction: May shower and wash wound with dial antibacterial soap and water prior to dressing change. Wound Cleanser Discharge Instruction: Cleanse the wound with wound cleanser prior to applying a clean dressing using gauze sponges, not tissue or cotton balls. Peri-Wound Care Sween Lotion (Moisturizing lotion) Discharge Instruction: Apply moisturizing lotion as directed Topical Primary Dressing Xeroform Occlusive Gauze Dressing, 4x4 in Discharge Instruction: Apply to wound bed as instructed Secondary Dressing Woven Gauze Sponge, Non-Sterile 4x4 in Discharge Instruction: Apply over primary dressing as directed. Secured With Compression Wrap Kerlix Roll 4.5x3.1 (in/yd) Discharge Instruction: Apply Kerlix and Coban compression as directed. Coban Self-Adherent Wrap 4x5 (in/yd) Discharge Instruction: Apply over Kerlix as directed. Compression Stockings Add-Ons Electronic Signature(s) Signed: 02/06/2022 12:02:25 PM By: Kalman Shan DO Entered By: Kalman Shan on 02/06/2022 11:58:27 -------------------------------------------------------------------------------- Multi-Disciplinary Care Plan Details Patient Name: Date of Service: Wanda Kilts F. 02/06/2022 11:15 A M Medical Record Number: [id number] Patient Account Number: [id number] Date of Birth/Sex: Treating RN: [birth year] (57 y.o. [name] Primary Care Jakwon Gayton: Fanny Bien Other Clinician: Referring Karon Cotterill: Treating Danyal Whitenack/Extender: Lenis Dickinson in Treatment: 2 Active Inactive Wound/Skin Impairment Nursing Diagnoses: Knowledge deficit related to ulceration/compromised skin integrity Goals: Patient/caregiver will verbalize understanding of skin care regimen Date Initiated: 01/23/2022 Target Resolution Date: 02/23/2022 Goal Status: Active NALEIGHA, RAIMONDI ([id number]) [id number] Page 6 of  9 Interventions: Assess patient/caregiver ability to perform ulcer/skin care regimen upon admission and as needed Assess ulceration(s) every visit Provide education on ulcer and skin care Treatment Activities: Skin care regimen initiated : 01/23/2022 Topical wound management initiated : 01/23/2022 Notes: Electronic Signature(s) Signed: 02/07/2022 6:38:55 PM By: Deon Pilling RN, BSN Entered By: Deon Pilling on 02/06/2022 11:40:39 -------------------------------------------------------------------------------- Pain Assessment Details Patient Name: Date of Service: Wanda Kilts F. 02/06/2022 11:15 A M Medical Record Number: [id number] Patient Account Number: [id number] Date of Birth/Sex: Treating RN: Apr 26, 1965 (57 y.o. F) Primary Care Rogan Ecklund: Fanny Bien Other Clinician: Referring Harrol Novello: Treating Demetria Iwai/Extender: Lenis Dickinson in Treatment: 2 Active Problems Location of Pain Severity and Description of Pain Patient Has Paino No Site Locations Pain Management and Medication Current Pain Management: Electronic Signature(s) Signed: 02/06/2022 11:37:02 AM By: Worthy Rancher Entered By: Worthy Rancher on 02/06/2022 11:29:57 Patient/Caregiver Education Details -------------------------------------------------------------------------------- Wanda [name] ([id number]) [id number] Page 7 of  9 Patient Name: Date of Service: Wanda [name], Wanda [name] 1/22/2024andnbsp11:15 A M Medical Record Number: [id number] Patient Account Number: [id number] Date of Birth/Gender: Treating RN: 09-19-1965 (57 y.o. [name] Primary Care Physician: Fanny Bien Other Clinician: Referring Physician: Treating Physician/Extender: Lenis Dickinson in Treatment: 2 Education Assessment Education Provided To: Patient Education Topics Provided Wound/Skin Impairment: Handouts: Caring for Your Ulcer Methods:  Explain/Verbal Responses: Reinforcements needed Electronic Signature(s) Signed: 02/07/2022 6:38:55 PM By: Deon Pilling RN, BSN Entered By: Deon Pilling on 02/06/2022 11:40:52 -------------------------------------------------------------------------------- Wound Assessment Details Patient Name: Date of Service: Wanda [name] 02/06/2022 11:15 A M Medical Record Number: [id number] Patient Account Number: [id number] Date of Birth/Sex: Treating RN: May 20, 1965 (57 y.o. F) Primary Care Janene Yousuf: Fanny Bien Other Clinician: Referring Jamesa Tedrick: Treating Yecenia Dalgleish/Extender: Kirby Funk Weeks in Treatment: 2 Wound Status Wound Number: 1 Primary Etiology: Open Surgical Wound Wound Location: Right, Medial Lower Leg Wound Status: Open Wounding Event: Surgical Injury Date Acquired: 11/17/2021 Weeks Of Treatment: 2 Clustered Wound: No Photos Wound Measurements Length: (cm) 0.6 Width: (cm) 0.4 Depth: (cm) 0.1 Area: (cm) 0.188 Volume: (cm) 0.019 % Reduction in Area: 88.6% % Reduction in Volume: 88.5% Epithelialization: Large (67-100%) Tunneling: No Undermining: No Wound Description Classification: Full Thickness With Exposed Support S Wound Margin: Distinct, outline attached Blandon, Pearletha Forge ([id number]) Exudate Amount: Medium Exudate Type: Serosanguineous Exudate Color: red, brown tructures Foul Odor After Cleansing: No Slough/Fibrino No [id number] Page 8 of 9 Wound Bed Granulation Amount: Large (67-100%) Exposed Structure Granulation Quality: Red, Pink Fascia Exposed: No Necrotic Amount: None Present (0%) Fat Layer (Subcutaneous Tissue) Exposed: Yes Tendon Exposed: No Muscle Exposed: No Joint Exposed: No Bone Exposed: No Periwound Skin Texture Texture Color No Abnormalities Noted: No No Abnormalities Noted: No Callus: No Atrophie Blanche: No Crepitus: No Cyanosis: No Excoriation: No Ecchymosis:  No Induration: No Erythema: No Rash: No Hemosiderin Staining: No Scarring: No Mottled: No Pallor: No Moisture Rubor: No No Abnormalities Noted: No Dry / Scaly: No Temperature / Pain Maceration: No Temperature: No Abnormality Tenderness on Palpation: Yes Treatment Notes Wound #1 (Lower Leg) Wound Laterality: Right, Medial Cleanser Soap and Water Discharge Instruction: May shower and wash wound with dial antibacterial soap and water prior to dressing change. Wound Cleanser Discharge Instruction: Cleanse the wound with wound cleanser prior to applying a clean dressing using gauze sponges, not tissue or cotton balls. Peri-Wound Care Sween Lotion (Moisturizing lotion) Discharge Instruction: Apply moisturizing lotion as directed Topical Primary Dressing Xeroform Occlusive Gauze Dressing, 4x4 in Discharge Instruction: Apply to wound bed as instructed Secondary Dressing Woven Gauze Sponge, Non-Sterile 4x4 in Discharge Instruction: Apply over primary dressing as directed. Secured With Compression Wrap Kerlix Roll 4.5x3.1 (in/yd) Discharge Instruction: Apply Kerlix and Coban compression as directed. Coban Self-Adherent Wrap 4x5 (in/yd) Discharge Instruction: Apply over Kerlix as directed. Compression Stockings Add-Ons Electronic Signature(s) Signed: 02/07/2022 6:38:55 PM By: Deon Pilling RN, BSN Previous Signature: 02/06/2022 11:37:02 AM Version By: Worthy Rancher Entered By: Deon Pilling on 02/06/2022 11:56:02 [place], Pearletha Forge ([id number]) [id number] Page 9 of 9 -------------------------------------------------------------------------------- Vitals Details Patient Name: Date of Service: Wanda [name], Wanda [name] 02/06/2022 11:15 A M Medical Record Number: [id number] Patient Account Number: [id number] Date of Birth/Sex: Treating RN: [birth year] (57 y.o. F) Primary Care Kendel Bessey: Fanny Bien Other Clinician: Referring Amalea Ottey: Treating  Ebonie Westerlund/Extender: Lenis Dickinson in Treatment: 2 Vital Signs Time Taken: 11:29 Temperature (F): 97.4 Height (in): 66 Pulse (bpm): 67 Weight (lbs): 175 Respiratory Rate (  breaths/min): 20 Body Mass Index (BMI): 28.2 Blood Pressure (mmHg): 113/72 Reference Range: 80 - 120 mg / dl Electronic Signature(s) Signed: 02/06/2022 11:37:02 AM By: Worthy Rancher Entered By: Worthy Rancher on 02/06/2022 11:29:48

## 2022-02-13 ENCOUNTER — Encounter (HOSPITAL_BASED_OUTPATIENT_CLINIC_OR_DEPARTMENT_OTHER): Payer: No Typology Code available for payment source | Admitting: Internal Medicine

## 2022-02-13 DIAGNOSIS — I87321 Chronic venous hypertension (idiopathic) with inflammation of right lower extremity: Secondary | ICD-10-CM | POA: Diagnosis not present

## 2022-02-13 DIAGNOSIS — T8131XA Disruption of external operation (surgical) wound, not elsewhere classified, initial encounter: Secondary | ICD-10-CM | POA: Diagnosis not present

## 2022-02-13 DIAGNOSIS — C4491 Basal cell carcinoma of skin, unspecified: Secondary | ICD-10-CM

## 2022-02-13 DIAGNOSIS — L97812 Non-pressure chronic ulcer of other part of right lower leg with fat layer exposed: Secondary | ICD-10-CM

## 2022-02-13 NOTE — Progress Notes (Signed)
Wanda [name] ([id number]) [id number] Page 1 of 6 Visit Report for 02/13/2022 Chief Complaint Document Details Patient Name: Date of Service: Wanda [name] 02/13/2022 8:00 A M Medical Record Number: [id number] Patient Account Number: [id number] Date of Birth/Sex: Treating RN: 1965/04/09 (57 y.o. F) Primary Care Provider: Fanny [name] Other Clinician: Referring Provider: Treating Provider/Extender: Wanda [name] in Treatment: 3 Information Obtained from: Patient Chief Complaint 01/23/2022; right lower extremity wound s/p basal cell carcinoma removal Electronic Signature(s) Signed: 02/13/2022 11:13:35 AM By: Wanda [name] Entered By: Wanda [name] on 02/13/2022 09:20:59 -------------------------------------------------------------------------------- HPI Details Patient Name: Date of Service: Wanda [name] F. 02/13/2022 8:00 A M Medical Record Number: [id number] Patient Account Number: [id number] Date of Birth/Sex: Treating RN: 1965-05-28 (57 y.o. F) Primary Care Provider: Fanny [name] Other Clinician: Referring Provider: Treating Provider/Extender: Wanda [name] in Treatment: 3 History of Present Illness HPI Description: 01/23/2022 Wanda [name] Is a 57 year old female with a past medical history of basal cell carcinoma to the right lower extremity status post removal of lesion on 11/2 by Wanda [name]. She had sutures in place however the area dehisced postsurgery There was concern for possible infection and she was on doxycycline. She was referred to Wanda [name] for slow healing wound. She has been using collagen to the area. Currently she denies signs of infection. She states there has been improvement in wound healing albeit slow. 1/15; so patient who had a basal cell carcinoma extracted I believe in early November. Using Medihoney and Hydrofera Blue. Arrives in clinic  with a lot of the dressing sticking to the wound surface. Difficult to remove. By description of the patient this was a superficial basal cell 1/22; patient presents for follow-up. We have been using Hydrofera Blue under Kerlix/Coban to the right lower extremity. Patient's wound is almost healed. 1/29; patient presents for follow-up. We used Xeroform under Kerlix/Coban for the past week. She has done well with this. Her wound is healed. Electronic Signature(s) Signed: 02/13/2022 11:13:35 AM By: Wanda [name] Entered By: Wanda [name] on 02/13/2022 09:21:39 Physical Exam Details -------------------------------------------------------------------------------- Wanda [name] ([id number]) [id number] Page 2 of 6 Patient Name: Date of Service: Wanda [name] 02/13/2022 8:00 A M Medical Record Number: [id number] Patient Account Number: [id number] Date of Birth/Sex: Treating RN: [birth year] (57 y.o. F) Primary Care Provider: Fanny [name] Other Clinician: Referring Provider: Treating Provider/Extender: Wanda [name] in Treatment: 3 Constitutional respirations regular, non-labored and within target range for patient.. Cardiovascular 2+ dorsalis pedis/posterior tibialis pulses. Psychiatric pleasant and cooperative. Notes T the right medial lower leg there is epithelization to the previous wound site. o Electronic Signature(s) Signed: 02/13/2022 11:13:35 AM By: Wanda [name] Entered By: Wanda [name] on 02/13/2022 09:22:29 -------------------------------------------------------------------------------- Physician Orders Details Patient Name: Date of Service: Wanda [name] F. 02/13/2022 8:00 A M Medical Record Number: [id number] Patient Account Number: [id number] Date of Birth/Sex: Treating RN: [birth year] (57 y.o. Wanda [name] Primary Care Provider: Fanny [name] Other Clinician: Referring  Provider: Treating Provider/Extender: Wanda [name] in Treatment: 3 Verbal / Phone Orders: No Diagnosis Coding ICD-10 Coding Code Description [phone] Non-pressure chronic ulcer of other part of right lower leg with fat layer exposed T81.31XA Disruption of external operation (surgical) wound, not elsewhere classified, initial encounter C44.91 Basal cell carcinoma of skin, unspecified I87.321 Chronic venous hypertension (idiopathic) with inflammation of right lower extremity Discharge From [facility] Discharge from [place] - Call  if any future wound care needs. Electronic Signature(s) Signed: 02/13/2022 11:13:35 AM By: Wanda [name] Entered By: Wanda [name] on 02/13/2022 09:24:55 -------------------------------------------------------------------------------- Problem List Details Patient Name: Date of Service: Wanda [name] F. 02/13/2022 8:00 A M Medical Record Number: [id number] Patient Account Number: [id number] Date of Birth/Sex: Treating RN: [birth year] (57 y.o. Wanda [name], Meta.Reding Primary Care Provider: Fanny [name] Other Clinician: Referring Provider: Treating Provider/Extender: [name], [name], [name] ([id number]) [phone].pdf Page 3 of 6 Weeks in Treatment: 3 Active Problems ICD-10 Encounter Code Description Active Date MDM Diagnosis L97.812 Non-pressure chronic ulcer of other part of right lower leg with fat layer 01/23/2022 No Yes exposed T81.31XA Disruption of external operation (surgical) wound, not elsewhere classified, 01/23/2022 No Yes initial encounter C44.91 Basal cell carcinoma of skin, unspecified 01/23/2022 No Yes I87.321 Chronic venous hypertension (idiopathic) with inflammation of right lower 01/23/2022 No Yes extremity Inactive Problems Resolved Problems Electronic Signature(s) Signed: 02/13/2022 11:13:35 AM By: Wanda [name] Entered By: Wanda [name] on 02/13/2022 09:20:47 -------------------------------------------------------------------------------- Progress Note Details Patient Name: Date of Service: Wanda [name] F. 02/13/2022 8:00 A M Medical Record Number: [id number] Patient Account Number: [id number] Date of Birth/Sex: Treating RN: April 06, 1965 (57 y.o. F) Primary Care Provider: Fanny [name] Other Clinician: Referring Provider: Treating Provider/Extender: Wanda [name] in Treatment: 3 Subjective Chief Complaint Information obtained from Patient 01/23/2022; right lower extremity wound s/p basal cell carcinoma removal History of Present Illness (HPI) 01/23/2022 Ms. Wanda [name] Is a 57 year old female with a past medical history of basal cell carcinoma to the right lower extremity status post removal of lesion on 11/2 by Wanda [name]. She had sutures in place however the area dehisced postsurgery There was concern for possible infection and she was on doxycycline. She was referred to Wanda [name] for slow healing wound. She has been using collagen to the area. Currently she denies signs of infection. She states there has been improvement in wound healing albeit slow. 1/15; so patient who had a basal cell carcinoma extracted I believe in early November. Using Medihoney and Hydrofera Blue. Arrives in clinic with a lot of the dressing sticking to the wound surface. Difficult to remove. By description of the patient this was a superficial basal cell 1/22; patient presents for follow-up. We have been using Hydrofera Blue under Kerlix/Coban to the right lower extremity. Patient's wound is almost healed. 1/29; patient presents for follow-up. We used Xeroform under Kerlix/Coban for the past week. She has done well with this. Her wound is healed. Patient History Information obtained from Patient, Chart. Family History Wanda [name] ([id number]) [id number] Page 4 of  6 Unknown History. Social History Never smoker, Marital Status - Married, Alcohol Use - Never, Drug Use - No History, Caffeine Use - Rarely. Medical History Hospitalization/Surgery History - lap appe. - umbilical hernia repair. - C-section. Objective Constitutional respirations regular, non-labored and within target range for patient.. Vitals Time Taken: 8:10 AM, Height: 66 in, Weight: 175 lbs, BMI: 28.2, Temperature: 98.7 F, Pulse: 69 bpm, Respiratory Rate: 20 breaths/min, Blood Pressure: 97/64 mmHg. Cardiovascular 2+ dorsalis pedis/posterior tibialis pulses. Psychiatric pleasant and cooperative. General Notes: T the right medial lower leg there is epithelization to the previous wound site. o Integumentary (Hair, Skin) Wound #1 status is Healed - Epithelialized. Original cause of wound was Surgical Injury. The date acquired was: 11/17/2021. The wound has been in treatment 3 weeks. The wound is located on the Right,Medial Lower Leg. The wound measures 0cm length  x 0cm width x 0cm depth; 0cm^2 area and 0cm^3 volume. There is no tunneling or undermining noted. There is a none present amount of drainage noted. The wound margin is distinct with the outline attached to the wound base. There is no granulation within the wound bed. There is no necrotic tissue within the wound bed. The periwound skin appearance did not exhibit: Callus, Crepitus, Excoriation, Induration, Rash, Scarring, Dry/Scaly, Maceration, Atrophie Blanche, Cyanosis, Ecchymosis, Hemosiderin Staining, Mottled, Pallor, Rubor, Erythema. Periwound temperature was noted as No Abnormality. The periwound has tenderness on palpation. Assessment Active Problems ICD-10 Non-pressure chronic ulcer of other part of right lower leg with fat layer exposed Disruption of external operation (surgical) wound, not elsewhere classified, initial encounter Basal cell carcinoma of skin, unspecified Chronic venous hypertension (idiopathic) with  inflammation of right lower extremity Patient has done well with Xeroform under Kerlix/Coban. Her wound is healed. The wound was secondary to basal cell carcinoma removal. At this time I [name] not think she needs compression stockings daily unless she has reopening of the wound. For now I recommended silicone-based ointment to help with scar reduction along with sunscreen when she goes outside. She may follow-up as needed. Plan Discharge From [facility]: Discharge from [place] - Call if any future wound care needs. 1. Discharge from clinic due to closed wound 2. Follow-up as needed Electronic Signature(s) Signed: 02/13/2022 11:13:35 AM By: Wanda [name] Entered By: Wanda [name] on 02/13/2022 09:26:38 Drolet, Pearletha [place] ([id number]) [id number] Page 5 of 6 -------------------------------------------------------------------------------- HxROS Details Patient Name: Date of Service: Wanda [name] 02/13/2022 8:00 A M Medical Record Number: [id number] Patient Account Number: [id number] Date of Birth/Sex: Treating RN: 1965/10/10 (57 y.o. F) Primary Care Provider: Fanny [name] Other Clinician: Referring Provider: Treating Provider/Extender: Wanda [name] in Treatment: 3 Information Obtained From Patient Chart Immunizations Implantable Devices None Hospitalization / Surgery History Type of Hospitalization/Surgery lap appe umbilical hernia repair C-section Family and Social History Unknown History: Yes; Never smoker; Marital Status - Married; Alcohol Use: Never; Drug Use: No History; Caffeine Use: Rarely; Financial Concerns: No; Food, Clothing or Shelter Needs: No; Support System Lacking: No; Transportation Concerns: No Electronic Signature(s) Signed: 02/13/2022 11:13:35 AM By: Wanda [name] Entered By: Wanda [name] on 02/13/2022  09:22:08 -------------------------------------------------------------------------------- SuperBill Details Patient Name: Date of Service: Wanda [name] 02/13/2022 Medical Record Number: [id number] Patient Account Number: [id number] Date of Birth/Sex: Treating RN: [birth year] (57 y.o. Wanda [name] Primary Care Provider: Fanny [name] Other Clinician: Referring Provider: Treating Provider/Extender: Wanda [name] in Treatment: 3 Diagnosis Coding ICD-10 Codes Code Description [phone] Non-pressure chronic ulcer of other part of right lower leg with fat layer exposed T81.31XA Disruption of external operation (surgical) wound, not elsewhere classified, initial encounter C44.91 Basal cell carcinoma of skin, unspecified I87.321 Chronic venous hypertension (idiopathic) with inflammation of right lower extremity Facility Procedures : CPT4 Code: [id number] Description: 99213 - WOUND CARE VISIT-LEV 3 EST PT Modifier: Quantity: 1 Physician Procedures : CPT4 Code Description Modifier Wanda [name], Wanda F ([id number]) [id number] 99213 - WC PHYS LEVEL 3 - EST PT 1 ICD-10 Diagnosis Description L97.812 Non-pressure chronic ulcer of other part of right lower leg with fat layer  exposed T81.31XA Disruption of external operation (surgical) wound, not elsewhere classified, initial encounter C44.91 Basal cell carcinoma of skin, unspecified I87.321 Chronic venous hypertension (idiopathic) with inflammation of right lower extremity Quantity: 27.pdf Page 6 of 6 Electronic Signature(s) Signed: 02/13/2022 11:13:35 AM By: Wanda Shan  [name] Entered By: Wanda [name] on 02/13/2022 09:26:51

## 2022-02-13 NOTE — Progress Notes (Signed)
ZALEY, TALLEY ([id number]) [id number] Page 1 of 8 Visit Report for 02/13/2022 Arrival Information Details Patient Name: Date of Service: Wanda [name], Wanda [name] 02/13/2022 8:00 A M Medical Record Number: [id number] Patient Account Number: [id number] Date of Birth/Sex: Treating RN: 11-17-1965 (57 y.o. Helene Shoe, Meta.Reding Primary Care Leontyne Manville: Fanny Bien Other Clinician: Referring Yoandri Congrove: Treating Jyles Sontag/Extender: Lenis Dickinson in Treatment: 3 Visit Information History Since Last Visit Added or deleted any medications: No Patient Arrived: Ambulatory Any new allergies or adverse reactions: No Arrival Time: 08:09 Had a fall or experienced change in No Accompanied By: self activities of daily living that may affect Transfer Assistance: None risk of falls: Patient Identification Verified: Yes Signs or symptoms of abuse/neglect since last visito No Secondary Verification Process Completed: Yes Hospitalized since last visit: No Patient Requires Transmission-Based Precautions: No Implantable device outside of the clinic excluding No Patient Has Alerts: No cellular tissue based products placed in the center since last visit: Has Dressing in Place as Prescribed: Yes Has Compression in Place as Prescribed: Yes Pain Present Now: No Electronic Signature(s) Signed: 02/13/2022 5:48:48 PM By: Deon Pilling RN, BSN Entered By: Deon Pilling on 02/13/2022 08:09:46 -------------------------------------------------------------------------------- Clinic Level of Care Assessment Details Patient Name: Date of Service: Wanda [name], Wanda [name] 02/13/2022 8:00 A M Medical Record Number: [id number] Patient Account Number: [id number] Date of Birth/Sex: Treating RN: 13-Jun-1965 (57 y.o. [name] Primary Care Ramia Sidney: Fanny Bien Other Clinician: Referring Shaquoya Cosper: Treating Markee Remlinger/Extender: Lenis Dickinson  in Treatment: 3 Clinic Level of Care Assessment Items TOOL 4 Quantity Score X- 1 0 Use when only an EandM is performed on FOLLOW-UP visit ASSESSMENTS - Nursing Assessment / Reassessment X- 1 10 Reassessment of Co-morbidities (includes updates in patient status) X- 1 5 Reassessment of Adherence to Treatment Plan ASSESSMENTS - Wound and Skin A ssessment / Reassessment X - Simple Wound Assessment / Reassessment - one wound 1 5 '[]'$  - 0 Complex Wound Assessment / Reassessment - multiple wounds X- 1 10 Dermatologic / Skin Assessment (not related to wound area) ASSESSMENTS - Focused Assessment X- 1 5 Circumferential Edema Measurements - multi extremities '[]'$  - 0 Nutritional Assessment / Counseling / Intervention MAIZEY, MENENDEZ ([id number]) [id number] Page 2 of 8 '[]'$  - 0 Lower Extremity Assessment (monofilament, tuning fork, pulses) '[]'$  - 0 Peripheral Arterial Disease Assessment (using hand held doppler) ASSESSMENTS - Ostomy and/or Continence Assessment and Care '[]'$  - 0 Incontinence Assessment and Management '[]'$  - 0 Ostomy Care Assessment and Management (repouching, etc.) PROCESS - Coordination of Care X - Simple Patient / Family Education for ongoing care 1 15 '[]'$  - 0 Complex (extensive) Patient / Family Education for ongoing care X- 1 10 Staff obtains Programmer, systems, Records, T Results / Process Orders est '[]'$  - 0 Staff telephones HHA, Nursing Homes / Clarify orders / etc '[]'$  - 0 Routine Transfer to another Facility (non-emergent condition) '[]'$  - 0 Routine Hospital Admission (non-emergent condition) '[]'$  - 0 New Admissions / Biomedical engineer / Ordering NPWT Apligraf, etc. , '[]'$  - 0 Emergency Hospital Admission (emergent condition) X- 1 10 Simple Discharge Coordination '[]'$  - 0 Complex (extensive) Discharge Coordination PROCESS - Special Needs '[]'$  - 0 Pediatric / Minor Patient Management '[]'$  - 0 Isolation Patient Management '[]'$  - 0 Hearing / Language  / Visual special needs '[]'$  - 0 Assessment of Community assistance (transportation, D/C planning, etc.) '[]'$  - 0 Additional assistance / Altered mentation '[]'$  - 0 Support Surface(s) Assessment (bed, cushion, seat, etc.)  INTERVENTIONS - Wound Cleansing / Measurement X - Simple Wound Cleansing - one wound 1 5 '[]'$  - 0 Complex Wound Cleansing - multiple wounds X- 1 5 Wound Imaging (photographs - any number of wounds) '[]'$  - 0 Wound Tracing (instead of photographs) X- 1 5 Simple Wound Measurement - one wound '[]'$  - 0 Complex Wound Measurement - multiple wounds INTERVENTIONS - Wound Dressings '[]'$  - 0 Small Wound Dressing one or multiple wounds '[]'$  - 0 Medium Wound Dressing one or multiple wounds '[]'$  - 0 Large Wound Dressing one or multiple wounds '[]'$  - 0 Application of Medications - topical '[]'$  - 0 Application of Medications - injection INTERVENTIONS - Miscellaneous '[]'$  - 0 External ear exam '[]'$  - 0 Specimen Collection (cultures, biopsies, blood, body fluids, etc.) '[]'$  - 0 Specimen(s) / Culture(s) sent or taken to Lab for analysis '[]'$  - 0 Patient Transfer (multiple staff / Civil Service fast streamer / Similar devices) '[]'$  - 0 Simple Staple / Suture removal (25 or less) '[]'$  - 0 Complex Staple / Suture removal (26 or more) '[]'$  - 0 Hypo / Hyperglycemic Management (close monitor of Blood Glucose) Cervenka, Pearletha Forge ([id number]) [id number] Page 3 of 8 '[]'$  - 0 Ankle / Brachial Index (ABI) - do not check if billed separately X- 1 5 Vital Signs Has the patient been seen at the hospital within the last three years: Yes Total Score: 90 Level Of Care: New/Established - Level 3 Electronic Signature(s) Signed: 02/13/2022 5:48:48 PM By: Deon Pilling RN, BSN Entered By: Deon Pilling on 02/13/2022 08:16:05 -------------------------------------------------------------------------------- Encounter Discharge Information Details Patient Name: Date of Service: Wanda Kilts F. 02/13/2022 8:00 A  M Medical Record Number: [id number] Patient Account Number: [id number] Date of Birth/Sex: Treating RN: 30-Aug-1965 (57 y.o. Helene Shoe, Tammi Klippel Primary Care Whitnie Deleon: Fanny Bien Other Clinician: Referring Tritia Endo: Treating Montario Zilka/Extender: Lenis Dickinson in Treatment: 3 Encounter Discharge Information Items Discharge Condition: Stable Ambulatory Status: Ambulatory Discharge Destination: Home Transportation: Private Auto Accompanied By: self Schedule Follow-up Appointment: No Clinical Summary of Care: Electronic Signature(s) Signed: 02/13/2022 5:48:48 PM By: Deon Pilling RN, BSN Entered By: Deon Pilling on 02/13/2022 08:16:28 -------------------------------------------------------------------------------- Lower Extremity Assessment Details Patient Name: Date of Service: Wanda Kilts F. 02/13/2022 8:00 A M Medical Record Number: [id number] Patient Account Number: [id number] Date of Birth/Sex: Treating RN: 1965/05/10 (57 y.o. Helene Shoe, Meta.Reding Primary Care Rudi Bunyard: Fanny Bien Other Clinician: Referring Milcah Dulany: Treating Jacobus Colvin/Extender: Kirby Funk Weeks in Treatment: 3 Edema Assessment Assessed: [Left: No] [Right: Yes] Edema: [Left: N] [Right: o] Calf Left: Right: Point of Measurement: 39 cm From Medial Instep 40 cm Ankle Left: Right: Point of Measurement: 9 cm From Medial Instep 23.5 cm Vascular Assessment Left: [[id number] Page 4 of 8Right:] Pulses: Dorsalis Pedis Palpable: [[id number] Page 4 of 8Yes] Electronic Signature(s) Signed: 02/13/2022 5:48:48 PM By: Deon Pilling RN, BSN Entered By: Deon Pilling on 02/13/2022 08:14:39 -------------------------------------------------------------------------------- Multi Wound Chart Details Patient Name: Date of Service: Wanda Kilts F. 02/13/2022 8:00 A M Medical Record Number: [id number] Patient  Account Number: [id number] Date of Birth/Sex: Treating RN: [birth year] (57 y.o. F) Primary Care Jalana Moore: Fanny Bien Other Clinician: Referring Maraki Macquarrie: Treating Ruby Logiudice/Extender: Lenis Dickinson in Treatment: 3 Vital Signs Height(in): 66 Pulse(bpm): 69 Weight(lbs): 175 Blood Pressure(mmHg): 97/64 Body Mass Index(BMI): 28.2 Temperature(F): 98.7 Respiratory Rate(breaths/min): 20 [1:Photos:] [N/A:N/A] Right, Medial Lower Leg N/A N/A Wound Location: Surgical Injury N/A N/A Wounding Event: Open Surgical Wound N/A N/A Primary Etiology: 11/17/2021 N/A N/A Date Acquired:  3 N/A N/A Weeks of Treatment: Healed - Epithelialized N/A N/A Wound Status: No N/A N/A Wound Recurrence: 0x0x0 N/A N/A Measurements L x W x D (cm) 0 N/A N/A A (cm) : rea 0 N/A N/A Volume (cm) : 100.00% N/A N/A % Reduction in A rea: 100.00% N/A N/A % Reduction in Volume: Full Thickness With Exposed Support N/A N/A Classification: Structures None Present N/A N/A Exudate Amount: Distinct, outline attached N/A N/A Wound Margin: None Present (0%) N/A N/A Granulation Amount: None Present (0%) N/A N/A Necrotic Amount: Fascia: No N/A N/A Exposed Structures: Fat Layer (Subcutaneous Tissue): No Tendon: No Muscle: No Joint: No Bone: No Large (67-100%) N/A N/A Epithelialization: Excoriation: No N/A N/A Periwound Skin Texture: Induration: No Callus: No Crepitus: No Rash: No Scarring: No Maceration: No N/A N/A Periwound Skin Moisture: Dry/Scaly: No Atrophie Blanche: No N/A N/A Periwound Skin Color: SHALISHA, CLAUSING ([id number]) [id number] Page 5 of 8 Cyanosis: No Ecchymosis: No Erythema: No Hemosiderin Staining: No Mottled: No Pallor: No Rubor: No No Abnormality N/A N/A Temperature: Yes N/A N/A Tenderness on Palpation: Treatment Notes Wound #1 (Lower Leg) Wound Laterality: Right, Medial Cleanser Peri-Wound  Care Topical Primary Dressing Secondary Dressing Secured With Compression Wrap Compression Stockings Add-Ons Electronic Signature(s) Signed: 02/13/2022 11:13:35 AM By: Kalman Shan DO Entered By: Kalman Shan on 02/13/2022 09:20:52 -------------------------------------------------------------------------------- Multi-Disciplinary Care Plan Details Patient Name: Date of Service: Wanda Kilts F. 02/13/2022 8:00 A M Medical Record Number: [id number] Patient Account Number: [id number] Date of Birth/Sex: Treating RN: 02/27/1965 (57 y.o. [name] Primary Care Virdia Ziesmer: Fanny Bien Other Clinician: Referring Tomoki Lucken: Treating Sabina Beavers/Extender: Lenis Dickinson in Treatment: 3 Active Inactive Electronic Signature(s) Signed: 02/13/2022 5:48:48 PM By: Deon Pilling RN, BSN Entered By: Deon Pilling on 02/13/2022 08:15:31 -------------------------------------------------------------------------------- Pain Assessment Details Patient Name: Date of Service: Wanda [name] 02/13/2022 8:00 A M Medical Record Number: [id number] Patient Account Number: [id number] Date of Birth/Sex: Treating RN: 20-Sep-1965 (57 y.o. Helene Shoe, Meta.Reding Primary Care Edmond Ginsberg: Fanny Bien Other Clinician: Referring Iridessa Harrow: Treating Mishka Stegemann/Extender: Lenis Dickinson in Treatment: [place], Pearletha Forge ([id number]) [id number] Page 6 of 8 Active Problems Location of Pain Severity and Description of Pain Patient Has Paino No Site Locations Rate the pain. Current Pain Level: 0 Pain Management and Medication Current Pain Management: Medication: No Cold Application: No Rest: No Massage: No Activity: No T.E.N.S.: No Heat Application: No Leg drop or elevation: No Is the Current Pain Management Adequate: Adequate How does your wound impact your activities of daily livingo Sleep: No Bathing:  No Appetite: No Relationship With Others: No Bladder Continence: No Emotions: No Bowel Continence: No Work: No Toileting: No Drive: No Dressing: No Hobbies: No Engineer, maintenance) Signed: 02/13/2022 5:48:48 PM By: Deon Pilling RN, BSN Entered By: Deon Pilling on 02/13/2022 08:14:33 -------------------------------------------------------------------------------- Patient/Caregiver Education Details Patient Name: Date of Service: Wanda [name], Wanda F. 1/29/2024andnbsp8:00 [place] Record Number: [id number] Patient Account Number: [id number] Date of Birth/Gender: Treating RN: Nov 20, 1965 (57 y.o. [name] Primary Care Physician: Fanny Bien Other Clinician: Referring Physician: Treating Physician/Extender: Lenis Dickinson in Treatment: 3 Education Assessment Education Provided To: Patient Education Topics Provided Wound/Skin Impairment: Handouts: Caring for Your Ulcer Methods: Explain/Verbal Responses: Reinforcements needed Wanda [name], Wanda [name] ([id number]) [id number] Page 7 of 8 Electronic Signature(s) Signed: 02/13/2022 5:48:48 PM By: Deon Pilling RN, BSN Entered By: Deon Pilling on 02/13/2022 08:15:40 -------------------------------------------------------------------------------- Wound Assessment Details Patient Name: Date of Service: Wanda [name].  02/13/2022 8:00 A M Medical Record Number: [id number] Patient Account Number: [id number] Date of Birth/Sex: Treating RN: 12-26-1965 (57 y.o. Helene Shoe, Meta.Reding Primary Care Cynthia Cogle: Fanny Bien Other Clinician: Referring Rebeca Valdivia: Treating Thornton Dohrmann/Extender: Kirby Funk Weeks in Treatment: 3 Wound Status Wound Number: 1 Primary Etiology: Open Surgical Wound Wound Location: Right, Medial Lower Leg Wound Status: Healed - Epithelialized Wounding Event: Surgical Injury Date Acquired: 11/17/2021 Weeks Of Treatment:  3 Clustered Wound: No Photos Wound Measurements Length: (cm) Width: (cm) Depth: (cm) Area: (cm) Volume: (cm) 0 % Reduction in Area: 100% 0 % Reduction in Volume: 100% 0 Epithelialization: Large (67-100%) 0 Tunneling: No 0 Undermining: No Wound Description Classification: Full Thickness With Exposed Suppor Wound Margin: Distinct, outline attached Exudate Amount: None Present t Structures Foul Odor After Cleansing: No Slough/Fibrino No Wound Bed Granulation Amount: None Present (0%) Exposed Structure Necrotic Amount: None Present (0%) Fascia Exposed: No Fat Layer (Subcutaneous Tissue) Exposed: No Tendon Exposed: No Muscle Exposed: No Joint Exposed: No Bone Exposed: No Periwound Skin Texture Texture Color No Abnormalities Noted: No No Abnormalities Noted: No Callus: No Atrophie Blanche: No Crepitus: No Cyanosis: No Excoriation: No Ecchymosis: No Induration: No Erythema: No Rash: No Hemosiderin Staining: No Scarring: No Mottled: No Pallor: No EBONEE, Wanda [name] ([id number]) [id number] Page 8 of 8 Pallor: No Moisture Rubor: No No Abnormalities Noted: No Dry / Scaly: No Temperature / Pain Maceration: No Temperature: No Abnormality Tenderness on Palpation: Yes Treatment Notes Wound #1 (Lower Leg) Wound Laterality: Right, Medial Cleanser Peri-Wound Care Topical Primary Dressing Secondary Dressing Secured With Compression Wrap Compression Stockings Add-Ons Electronic Signature(s) Signed: 02/13/2022 5:48:48 PM By: Deon Pilling RN, BSN Entered By: Deon Pilling on 02/13/2022 08:17:05 -------------------------------------------------------------------------------- Vitals Details Patient Name: Date of Service: Wanda Kilts F. 02/13/2022 8:00 A M Medical Record Number: [id number] Patient Account Number: [id number] Date of Birth/Sex: Treating RN: 09-15-1965 (56 y.o. Helene Shoe, Meta.Reding Primary Care Aija Scarfo: Fanny Bien  Other Clinician: Referring Lorna Strother: Treating Sherif Millspaugh/Extender: Lenis Dickinson in Treatment: 3 Vital Signs Time Taken: 08:10 Temperature (F): 98.7 Height (in): 66 Pulse (bpm): 69 Weight (lbs): 175 Respiratory Rate (breaths/min): 20 Body Mass Index (BMI): 28.2 Blood Pressure (mmHg): 97/64 Reference Range: 80 - 120 mg / dl Electronic Signature(s) Signed: 02/13/2022 5:48:48 PM By: Deon Pilling RN, BSN Entered By: Deon Pilling on 02/13/2022 08:10:41

## 2022-02-24 ENCOUNTER — Institutional Professional Consult (permissible substitution): Payer: No Typology Code available for payment source | Admitting: Plastic Surgery

## 2022-03-06 NOTE — Progress Notes (Signed)
Wanda [name], Wanda [name] ([id number]) [id number] Page 1 of 6 Visit Report for 01/30/2022 Debridement Details Patient Name: Date of Service: Wanda [name], Wanda [name] 01/30/2022 11:15 A M Medical Record Number: [id number] Patient Account Number: [id number] Date of Birth/Sex: Treating RN: 02/10/1965 (57 y.o. F) Primary Care Provider: Fanny Bien Other Clinician: Referring Provider: Treating Provider/Extender: Scheryl Marten in Treatment: 1 Debridement Performed for Assessment: Wound #1 Right,Medial Lower Leg Performed By: Physician Ricard Dillon., MD Debridement Type: Debridement Level of Consciousness (Pre-procedure): Awake and Alert Pre-procedure Verification/Time Out Yes - 11:30 Taken: Start Time: 11:30 Pain Control: Lidocaine T Area Debrided (L x W): otal 1 (cm) x 0.5 (cm) = 0.5 (cm) Tissue and other material debrided: Viable, Non-Viable, Eschar, Skin: Dermis , Skin: Epidermis Level: Skin/Epidermis Debridement Description: Selective/Open Wound Instrument: Curette Bleeding: Minimum Hemostasis Achieved: Pressure End Time: 11:30 Procedural Pain: 0 Post Procedural Pain: 0 Response to Treatment: Procedure was tolerated well Level of Consciousness (Post- Awake and Alert procedure): Post Debridement Measurements of Total Wound Length: (cm) 1 Width: (cm) 0.5 Depth: (cm) 0.1 Volume: (cm) 0.039 Character of Wound/Ulcer Post Debridement: Improved Post Procedure Diagnosis Same as Pre-procedure Electronic Signature(s) Signed: 01/30/2022 3:35:21 PM By: Linton Ham MD Entered By: Linton Ham on 01/30/2022 11:46:07 -------------------------------------------------------------------------------- HPI Details Patient Name: Date of Service: Wanda Kilts F. 01/30/2022 11:15 A M Medical Record Number: [id number] Patient Account Number: [id number] Date of Birth/Sex: Treating RN: 05-07-1965 (57 y.o. F) Primary Care Provider:  Fanny Bien Other Clinician: Referring Provider: Treating Provider/Extender: Scheryl Marten in Treatment: 1 History of Present Illness HPI Description: 01/23/2022 YEHUDIS, PODRAZA ([id number]) [id number] Page 2 of 6 Ms. Shivani Calisto Is a 57 year old female with a past medical history of basal cell carcinoma to the right lower extremity status post removal of lesion on 11/2 by Dr. Dennison Mascot. She had sutures in place however the area dehisced postsurgery There was concern for possible infection and she was on doxycycline. She was referred to Dr. Iran Planas for slow healing wound. She has been using collagen to the area. Currently she denies signs of infection. She states there has been improvement in wound healing albeit slow. 1/15; so patient who had a basal cell carcinoma extracted I believe in early November. Using Medihoney and Hydrofera Blue. Arrives in clinic with a lot of the dressing sticking to the wound surface. Difficult to remove. By description of the patient this was a superficial basal cell Electronic Signature(s) Signed: 01/30/2022 3:35:21 PM By: Linton Ham MD Entered By: Linton Ham on 01/30/2022 11:51:43 -------------------------------------------------------------------------------- Physical Exam Details Patient Name: Date of Service: Wanda Kilts F. 01/30/2022 11:15 A M Medical Record Number: [id number] Patient Account Number: [id number] Date of Birth/Sex: Treating RN: [birth year] (57 y.o. F) Primary Care Provider: Fanny Bien Other Clinician: Referring Provider: Treating Provider/Extender: Scheryl Marten in Treatment: 1 Constitutional Sitting or standing Blood Pressure is within target range for patient.. Pulse regular and within target range for patient.Marland Kitchen Respirations regular, non-labored and within target range.. Temperature is normal and within the target range for  the patient.Marland Kitchen Appears in no distress. Notes Wound exam; right medial lower extremity. Small oval-shaped wound. Callus and dry skin removed with a #3 curette from around the majority of the wound circumference. The rest of this looks reasonably healthy. Edema control looks reasonable. No evidence of infection Electronic Signature(s) Signed: 01/30/2022 3:35:21 PM By: Linton Ham MD Entered By: Linton Ham on 01/30/2022 11:55:52 -------------------------------------------------------------------------------- Physician Orders Details Patient  Name: Date of Service: Wanda [name], Wanda [name] 01/30/2022 11:15 A M Medical Record Number: [id number] Patient Account Number: [id number] Date of Birth/Sex: Treating RN: 1965-09-24 (57 y.o. Tonita Phoenix, Lauren Primary Care Provider: Fanny Bien Other Clinician: Referring Provider: Treating Provider/Extender: Scheryl Marten in Treatment: 1 Verbal / Phone Orders: No Diagnosis Coding Follow-up Appointments ppointment in 1 week. - Dr. Heber [place] Room 8 1/22/224 1115 Return A Anesthetic (In clinic) Topical Lidocaine 4% applied to wound bed Bathing/ Shower/ Hygiene May shower with protection but do not get wound dressing(s) wet. Protect dressing(s) with water repellant cover (for example, large plastic bag) or a cast cover and may then take shower. Edema Control - Lymphedema / SCD / Other Elevate legs to the level of the heart or above for 30 minutes daily and/or when sitting for 3-4 times a day throughout the day. Avoid standing for long periods of time. Exercise regularly Wanda [name], Wanda [name] ([id number]) [id number] Page 3 of 6 Wound Treatment Wound #1 - Lower Leg Wound Laterality: Right, Medial Cleanser: Soap and Water 1 x Per Week/30 Days Discharge Instructions: May shower and wash wound with dial antibacterial soap and water prior to dressing change. Cleanser: Wound Cleanser 1 x Per Week/30  Days Discharge Instructions: Cleanse the wound with wound cleanser prior to applying a clean dressing using gauze sponges, not tissue or cotton balls. Peri-Wound Care: Sween Lotion (Moisturizing lotion) 1 x Per Week/30 Days Discharge Instructions: Apply moisturizing lotion as directed Prim Dressing: Hydrofera Blue Ready Transfer Foam, 4x5 (in/in) ary 1 x Per Week/30 Days Discharge Instructions: Apply over medihoney. Prim Dressing: MediHoney Gel, tube 1.5 (oz) 1 x Per Week/30 Days ary Discharge Instructions: Apply directly to wound bed. Secondary Dressing: ABD Pad, 8x10 1 x Per Week/30 Days Discharge Instructions: Apply over primary dressing as directed. Compression Wrap: Kerlix Roll 4.5x3.1 (in/yd) 1 x Per Week/30 Days Discharge Instructions: Apply Kerlix and Coban compression as directed. Compression Wrap: Coban Self-Adherent Wrap 4x5 (in/yd) 1 x Per Week/30 Days Discharge Instructions: Apply over Kerlix as directed. Electronic Signature(s) Signed: 01/30/2022 3:35:21 PM By: Linton Ham MD Signed: 03/06/2022 10:40:58 AM By: Rhae Hammock RN Entered By: Rhae Hammock on 01/30/2022 11:27:51 -------------------------------------------------------------------------------- Problem List Details Patient Name: Date of Service: Wanda Kilts F. 01/30/2022 11:15 A M Medical Record Number: [id number] Patient Account Number: [id number] Date of Birth/Sex: Treating RN: 1965-09-15 (57 y.o. 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/23/2022 No Yes exposed T81.31XA Disruption of external operation (surgical) wound, not elsewhere classified, 01/23/2022 No Yes initial encounter C44.91 Basal cell carcinoma of  skin, unspecified 01/23/2022 No Yes I87.321 Chronic venous hypertension (idiopathic) with inflammation of right lower 01/23/2022 No Yes extremity Inactive Problems Wanda [name], Wanda [name] ([id number]) [id number] Page 4 of 6 Resolved Problems Electronic Signature(s) Signed: 01/30/2022 3:35:21 PM By: Linton Ham MD Entered By: Linton Ham on 01/30/2022 11:38:55 -------------------------------------------------------------------------------- Progress Note Details Patient Name: Date of Service: Wanda Kilts F. 01/30/2022 11:15 A M Medical Record Number: [id number] Patient Account Number: [id number] Date of Birth/Sex: Treating RN: 11-19-1965 (57 y.o. F) Primary Care Provider: Fanny Bien Other Clinician: Referring Provider: Treating Provider/Extender: Scheryl Marten in Treatment: 1 Subjective History of Present Illness (HPI) 01/23/2022 Ms. Pamie Tallmadge Is a 57 year old female with a past medical  history of basal cell carcinoma to the right lower extremity status post removal of lesion on 11/2 by Dr. Dennison Mascot. She had sutures in place however the area dehisced postsurgery There was concern for possible infection and she was on doxycycline. She was referred to Dr. Iran Planas for slow healing wound. She has been using collagen to the area. Currently she denies signs of infection. She states there has been improvement in wound healing albeit slow. 1/15; so patient who had a basal cell carcinoma extracted I believe in early November. Using Medihoney and Hydrofera Blue. Arrives in clinic with a lot of the dressing sticking to the wound surface. Difficult to remove. By description of the patient this was a superficial basal cell Objective Constitutional Sitting or standing Blood Pressure is within target range for patient.. Pulse regular and within target range for patient.Marland Kitchen Respirations regular, non-labored and within target range..  Temperature is normal and within the target range for the patient.Marland Kitchen Appears in no distress. Vitals Time Taken: 11:20 AM, Height: 66 in, Weight: 175 lbs, BMI: 28.2, Temperature: 97.7 F, Pulse: 94 bpm, Respiratory Rate: 18 breaths/min, Blood Pressure: 121/75 mmHg. General Notes: Wound exam; right medial lower extremity. Small oval-shaped wound. Callus and dry skin removed with a #3 curette from around the majority of the wound circumference. The rest of this looks reasonably healthy. Edema control looks reasonable. No evidence of infection Integumentary (Hair, Skin) Wound #1 status is Open. Original cause of wound was Surgical Injury. The date acquired was: 11/17/2021. The wound has been in treatment 1 weeks. The wound is located on the Right,Medial Lower Leg. The wound measures 1cm length x 0.5cm width x 0.1cm depth; 0.393cm^2 area and 0.039cm^3 volume. There is Fat Layer (Subcutaneous Tissue) exposed. There is no tunneling or undermining noted. There is a medium amount of serosanguineous drainage noted. The wound margin is distinct with the outline attached to the wound base. There is large (67-100%) red, pink granulation within the wound bed. There is a small (1- 33%) amount of necrotic tissue within the wound bed including Adherent Slough. The periwound skin appearance did not exhibit: Callus, Crepitus, Excoriation, Induration, Rash, Scarring, Dry/Scaly, Maceration, Atrophie Blanche, Cyanosis, Ecchymosis, Hemosiderin Staining, Mottled, Pallor, Rubor, Erythema. Periwound temperature was noted as No Abnormality. The periwound has tenderness on palpation. Assessment Active Problems ICD-10 Non-pressure chronic ulcer of other part of right lower leg with fat layer exposed Disruption of external operation (surgical) wound, not elsewhere classified, initial encounter Basal cell carcinoma of skin, unspecified Chronic venous hypertension (idiopathic) with inflammation of right lower extremity Wanda [name], Wanda [name] ([id number]) [id number] Page 5 of 6 Procedures Wound #1 Pre-procedure diagnosis of Wound #1 is an Open Surgical Wound located on the Right,Medial Lower Leg . There was a Selective/Open Wound Skin/Epidermis Debridement with a total area of 0.5 sq cm performed by Ricard Dillon., MD. With the following instrument(s): Curette to remove Viable and Non-Viable tissue/material. Material removed includes Eschar, Skin: Dermis, and Skin: Epidermis after achieving pain control using Lidocaine. No specimens were taken. A time out was conducted at 11:30, prior to the start of the procedure. A Minimum amount of bleeding was controlled with Pressure. The procedure was tolerated well with a pain level of 0 throughout and a pain level of 0 following the procedure. Post Debridement Measurements: 1cm length x 0.5cm width x 0.1cm depth; 0.039cm^3 volume. Character of Wound/Ulcer Post Debridement is improved. Post procedure Diagnosis Wound #1: Same as Pre-Procedure Plan Follow-up Appointments: Return Appointment in 1 week. -  Dr. Heber [place] Room 8 1/22/224 1115 Anesthetic: (In clinic) Topical Lidocaine 4% applied to wound bed Bathing/ Shower/ Hygiene: May shower with protection but do not get wound dressing(s) wet. Protect dressing(s) with water repellant cover (for example, large plastic bag) or a cast cover and may then take shower. Edema Control - Lymphedema / SCD / Other: Elevate legs to the level of the heart or above for 30 minutes daily and/or when sitting for 3-4 times a day throughout the day. Avoid standing for long periods of time. Exercise regularly WOUND #1: - Lower Leg Wound Laterality: Right, Medial Cleanser: Soap and Water 1 x Per Week/30 Days Discharge Instructions: May shower and wash wound with dial antibacterial soap and water prior to dressing change. Cleanser: Wound Cleanser 1 x Per Week/30 Days Discharge Instructions: Cleanse the wound with wound  cleanser prior to applying a clean dressing using gauze sponges, not tissue or cotton balls. Peri-Wound Care: Sween Lotion (Moisturizing lotion) 1 x Per Week/30 Days Discharge Instructions: Apply moisturizing lotion as directed Prim Dressing: Hydrofera Blue Ready Transfer Foam, 4x5 (in/in) 1 x Per Week/30 Days ary Discharge Instructions: Apply over medihoney. Prim Dressing: MediHoney Gel, tube 1.5 (oz) 1 x Per Week/30 Days ary Discharge Instructions: Apply directly to wound bed. Secondary Dressing: ABD Pad, 8x10 1 x Per Week/30 Days Discharge Instructions: Apply over primary dressing as directed. Com pression Wrap: Kerlix Roll 4.5x3.1 (in/yd) 1 x Per Week/30 Days Discharge Instructions: Apply Kerlix and Coban compression as directed. Com pression Wrap: Coban Self-Adherent Wrap 4x5 (in/yd) 1 x Per Week/30 Days Discharge Instructions: Apply over Kerlix as directed. 1. Debridement as noted 2. Changed to Hydrofera Blue under kerlix Coban. Electronic Signature(s) Signed: 01/30/2022 3:35:21 PM By: Linton Ham MD Entered By: Linton Ham on 01/30/2022 11:56:27 -------------------------------------------------------------------------------- SuperBill Details Patient Name: Date of Service: Wanda [name] 01/30/2022 Medical Record Number: [id number] Patient Account Number: [id number] Date of Birth/Sex: Treating RN: [birth year] (57 y.o. Tonita Phoenix, Lauren Primary Care Provider: Fanny Bien Other Clinician: Referring Provider: Treating Provider/Extender: Scheryl Marten in Treatment: 1 Diagnosis Coding ICD-10 Codes Wanda [name], Wanda [name] ([id number]) [id number] Page 6 of 6 Code Description [id number] Non-pressure chronic ulcer of other part of right lower leg with fat layer exposed T81.31XA Disruption of external operation (surgical) wound, not elsewhere classified, initial encounter C44.91 Basal cell carcinoma of skin,  unspecified I87.321 Chronic venous hypertension (idiopathic) with inflammation of right lower extremity Facility Procedures : CPT4 Code: [id number] Description: [id number] - DEBRIDE WOUND 1ST 20 SQ CM OR < ICD-10 Diagnosis Description [id number] Non-pressure chronic ulcer of other part of right lower leg with fat layer expos Modifier: ed Quantity: 1 Physician Procedures : CPT4 Code Description Modifier [id number] - WC PHYS DEBR WO ANESTH 20 SQ CM ICD-10 Diagnosis Description [id number] Non-pressure chronic ulcer of other part of right lower leg with fat layer exposed Quantity: 1 Electronic Signature(s) Signed: 01/30/2022 3:35:21 PM By: Linton Ham MD Entered By: Linton Ham on 01/30/2022 11:56:42

## 2022-03-06 NOTE — Progress Notes (Signed)
Wanda, [name] ([id number]) [id number] Page 1 of 7 Visit Report for 01/30/2022 Arrival Information Details Patient Name: Date of Service: Wanda [name], Wanda [name] 01/30/2022 11:15 A M Medical Record Number: [id number] Patient Account Number: [id number] Date of Birth/Sex: Treating RN: [birth year] (57 y.o. Tonita Phoenix, Lauren Primary Care Jaileigh Weimer: Fanny Bien Other Clinician: Referring Dhalia Zingaro: Treating Alee Katen/Extender: Scheryl Marten in Treatment: 1 Visit Information History Since Last Visit Added or deleted any medications: No Patient Arrived: Ambulatory Any new allergies or adverse reactions: No Arrival Time: 11:17 Had a fall or experienced change in No Accompanied By: self activities of daily living that may affect Transfer Assistance: None risk of falls: Patient Identification Verified: Yes Signs or symptoms of abuse/neglect since last visito No Secondary Verification Process Completed: Yes Hospitalized since last visit: No Patient Requires Transmission-Based Precautions: No Implantable device outside of the clinic excluding No Patient Has Alerts: No cellular tissue based products placed in the center since last visit: Has Dressing in Place as Prescribed: Yes Has Compression in Place as Prescribed: Yes Pain Present Now: No Electronic Signature(s) Signed: 03/06/2022 10:40:58 AM By: Rhae Hammock RN Entered By: Rhae Hammock on 01/30/2022 11:18:23 -------------------------------------------------------------------------------- Encounter Discharge Information Details Patient Name: Date of Service: Wanda Kilts F. 01/30/2022 11:15 A M Medical Record Number: [id number] Patient Account Number: [id number] Date of Birth/Sex: Treating RN: September 30, 1965 (57 y.o. Tonita Phoenix, Lauren Primary Care Detravion Tester: Fanny Bien Other Clinician: Referring Jovonte Commins: Treating Glenda Spelman/Extender: Scheryl Marten in Treatment: 1 Encounter Discharge Information Items Post Procedure Vitals Discharge Condition: Stable Temperature (F): 98.7 Ambulatory Status: Ambulatory Pulse (bpm): 74 Discharge Destination: Home Respiratory Rate (breaths/min): 17 Transportation: Private Auto Blood Pressure (mmHg): 120/80 Accompanied By: self Schedule Follow-up Appointment: Yes Clinical Summary of Care: Patient Declined Electronic Signature(s) Signed: 03/06/2022 10:40:58 AM By: Rhae Hammock RN Entered By: Rhae Hammock on 01/30/2022 11:46:31 [place], Pearletha Forge ([id number]) [id number] Page 2 of 7 -------------------------------------------------------------------------------- Lower Extremity Assessment Details Patient Name: Date of Service: Wanda [name], Wanda [name] 01/30/2022 11:15 A M Medical Record Number: [id number] Patient Account Number: [id number] Date of Birth/Sex: Treating RN: 1965/03/20 (57 y.o. Tonita Phoenix, Lauren Primary Care Kamyla Olejnik: Fanny Bien Other Clinician: Referring Santez Woodcox: Treating Sabrine Patchen/Extender: Leana Gamer Weeks in Treatment: 1 Edema Assessment Assessed: [Left: No] [Right: Yes] Edema: [Left: Ye] [Right: s] Calf Left: Right: Point of Measurement: 39 cm From Medial Instep 40 cm Ankle Left: Right: Point of Measurement: 9 cm From Medial Instep 23.5 cm Vascular Assessment Pulses: Dorsalis Pedis Palpable: [Right:Yes] Posterior Tibial Palpable: [Right:Yes] Electronic Signature(s) Signed: 03/06/2022 10:40:58 AM By: Rhae Hammock RN Entered By: Rhae Hammock on 01/30/2022 11:22:12 -------------------------------------------------------------------------------- Multi Wound Chart Details Patient Name: Date of Service: Wanda Kilts F. 01/30/2022 11:15 A M Medical Record Number: [id number] Patient Account Number: [id number] Date of Birth/Sex: Treating RN: 10-May-1965 (57 y.o. F) Primary Care  Daion Ginsberg: Fanny Bien Other Clinician: Referring Andjela Wickes: Treating Kendall Justo/Extender: Scheryl Marten in Treatment: 1 Vital Signs Height(in): 66 Pulse(bpm): 94 Weight(lbs): 175 Blood Pressure(mmHg): 121/75 Body Mass Index(BMI): 28.2 Temperature(F): 97.7 Respiratory Rate(breaths/min): 18 [1:Photos:] [N/A:N/A] Right, Medial Lower Leg N/A N/A Wound Location: Surgical Injury N/A N/A Wounding Event: Open Surgical Wound N/A N/A Primary Etiology: 11/17/2021 N/A N/A Date Acquired: 1 N/A N/A Weeks of Treatment: Open N/A N/A Wound Status: No N/A N/A Wound Recurrence: 1x0.5x0.1 N/A N/A Measurements L x W x D (cm) 0.393 N/A N/A A (cm) : rea 0.039 N/A N/A Volume (cm) : 76.20%  N/A N/A % Reduction in A rea: 76.40% N/A N/A % Reduction in Volume: Full Thickness With Exposed Support N/A N/A Classification: Structures Medium N/A N/A Exudate A mount: Serosanguineous N/A N/A Exudate Type: red, brown N/A N/A Exudate Color: Distinct, outline attached N/A N/A Wound Margin: Large (67-100%) N/A N/A Granulation A mount: Red, Pink N/A N/A Granulation Quality: Small (1-33%) N/A N/A Necrotic A mount: Fat Layer (Subcutaneous Tissue): Yes N/A N/A Exposed Structures: Fascia: No Tendon: No Muscle: No Joint: No Bone: No Large (67-100%) N/A N/A Epithelialization: Debridement - Selective/Open Wound N/A N/A Debridement: Pre-procedure Verification/Time Out 11:30 N/A N/A Taken: Lidocaine N/A N/A Pain Control: Necrotic/Eschar N/A N/A Tissue Debrided: Skin/Epidermis N/A N/A Level: 0.5 N/A N/A Debridement A (sq cm): rea Curette N/A N/A Instrument: Minimum N/A N/A Bleeding: Pressure N/A N/A Hemostasis A chieved: 0 N/A N/A Procedural Pain: 0 N/A N/A Post Procedural Pain: Procedure was tolerated well N/A N/A Debridement Treatment Response: 1x0.5x0.1 N/A N/A Post Debridement Measurements L x W x D (cm) 0.039 N/A N/A Post Debridement  Volume: (cm) Excoriation: No N/A N/A Periwound Skin Texture: Induration: No Callus: No Crepitus: No Rash: No Scarring: No Maceration: No N/A N/A Periwound Skin Moisture: Dry/Scaly: No Atrophie Blanche: No N/A N/A Periwound Skin Color: Cyanosis: No Ecchymosis: No Erythema: No Hemosiderin Staining: No Mottled: No Pallor: No Rubor: No No Abnormality N/A N/A Temperature: Yes N/A N/A Tenderness on Palpation: Debridement N/A N/A Procedures Performed: Treatment Notes Electronic Signature(s) Signed: 01/30/2022 3:35:21 PM By: Linton Ham MD Entered By: Linton Ham on 01/30/2022 11:39:51 Multi-Disciplinary Care Plan Details -------------------------------------------------------------------------------- Wanda [name] ([id number]) [id number] Page 4 of 7 Patient Name: Date of Service: Wanda [name], Wanda [name] 01/30/2022 11:15 A M Medical Record Number: [id number] Patient Account Number: [id number] Date of Birth/Sex: Treating RN: 1965-11-28 (57 y.o. Tonita Phoenix, Lauren Primary Care Leaira Fullam: Fanny Bien Other Clinician: Referring Addysin Porco: Treating Genevra Orne/Extender: Scheryl Marten in Treatment: 1 Active Inactive Wound/Skin Impairment Nursing Diagnoses: Knowledge deficit related to ulceration/compromised skin integrity Goals: Patient/caregiver will verbalize understanding of skin care regimen Date Initiated: 01/23/2022 Target Resolution Date: 02/23/2022 Goal Status: Active Interventions: Assess patient/caregiver ability to perform ulcer/skin care regimen upon admission and as needed Assess ulceration(s) every visit Provide education on ulcer and skin care Treatment Activities: Skin care regimen initiated : 01/23/2022 Topical wound management initiated : 01/23/2022 Notes: Electronic Signature(s) Signed: 03/06/2022 10:40:58 AM By: Rhae Hammock RN Entered By: Rhae Hammock on 01/30/2022  11:25:32 -------------------------------------------------------------------------------- Pain Assessment Details Patient Name: Date of Service: Wanda Kilts F. 01/30/2022 11:15 A M Medical Record Number: [id number] Patient Account Number: [id number] Date of Birth/Sex: Treating RN: 1965/10/22 (57 y.o. Tonita Phoenix, Lauren Primary Care Madalina Rosman: Fanny Bien Other Clinician: Referring Brandalyn Harting: Treating Stevie Charter/Extender: Scheryl Marten in Treatment: 1 Active Problems Location of Pain Severity and Description of Pain Patient Has Paino No Site Locations Wanda [name], Wanda [name] F ([id number]) [id number] Page 5 of 7 Pain Management and Medication Current Pain Management: Electronic Signature(s) Signed: 03/06/2022 10:40:58 AM By: Rhae Hammock RN Entered By: Rhae Hammock on 01/30/2022 11:18:45 -------------------------------------------------------------------------------- Patient/Caregiver Education Details Patient Name: Date of Service: Wanda [name] 1/15/2024andnbsp11:15 A M Medical Record Number: [id number] Patient Account Number: [id number] Date of Birth/Gender: Treating RN: 07-Feb-1965 (57 y.o. Tonita Phoenix, Lauren Primary Care Physician: Fanny Bien Other Clinician: Referring Physician: Treating Physician/Extender: Scheryl Marten in Treatment: 1 Education Assessment Education Provided To: Patient Education Topics Provided Wound/Skin Impairment: Methods: Explain/Verbal Responses: Reinforcements needed, State content correctly  Electronic Signature(s) Signed: 03/06/2022 10:40:58 AM By: Rhae Hammock RN Entered By: Rhae Hammock on 01/30/2022 11:25:44 -------------------------------------------------------------------------------- Wound Assessment Details Patient Name: Date of Service: Wanda Kilts F. 01/30/2022 11:15 A M Medical Record Number: [id number] Patient  Account Number: [id number] Date of Birth/Sex: Treating RN: [birth year] (57 y.o. Tonita Phoenix, Lauren Primary Care Zelie Asbill: Fanny Bien Other Clinician: Referring Jameriah Trotti: Treating Bexlee Bergdoll/Extender: Leana Gamer Weeks in Treatment: 1 Wound Status Wound Number: 1 Primary Etiology: Open Surgical Wound Wound Location: Right, Medial Lower Leg Wound Status: Open Wounding Event: Surgical Injury Date Acquired: 11/17/2021 Weeks Of Treatment: 1 Clustered Wound: No Photos Wanda [name], Wanda [name] ([id number]) [id number] Page 6 of 7 Wound Measurements Length: (cm) 1 Width: (cm) 0.5 Depth: (cm) 0.1 Area: (cm) 0.393 Volume: (cm) 0.039 % Reduction in Area: 76.2% % Reduction in Volume: 76.4% Epithelialization: Large (67-100%) Tunneling: No Undermining: No Wound Description Classification: Full Thickness With Exposed Suppo Wound Margin: Distinct, outline attached Exudate Amount: Medium Exudate Type: Serosanguineous Exudate Color: red, brown rt Structures Foul Odor After Cleansing: No Slough/Fibrino Yes Wound Bed Granulation Amount: Large (67-100%) Exposed Structure Granulation Quality: Red, Pink Fascia Exposed: No Necrotic Amount: Small (1-33%) Fat Layer (Subcutaneous Tissue) Exposed: Yes Necrotic Quality: Adherent Slough Tendon Exposed: No Muscle Exposed: No Joint Exposed: No Bone Exposed: No Periwound Skin Texture Texture Color No Abnormalities Noted: No No Abnormalities Noted: No Callus: No Atrophie Blanche: No Crepitus: No Cyanosis: No Excoriation: No Ecchymosis: No Induration: No Erythema: No Rash: No Hemosiderin Staining: No Scarring: No Mottled: No Pallor: No Moisture Rubor: No No Abnormalities Noted: No Dry / Scaly: No Temperature / Pain Maceration: No Temperature: No Abnormality Tenderness on Palpation: Yes Electronic Signature(s) Signed: 01/30/2022 3:27:15 PM By: Erenest Blank Signed: 03/06/2022  10:40:58 AM By: Rhae Hammock RN Entered By: Erenest Blank on 01/30/2022 11:23:03 -------------------------------------------------------------------------------- Vitals Details Patient Name: Date of Service: Wanda Kilts F. 01/30/2022 11:15 A M Medical Record Number: [id number] Patient Account Number: [id number] Date of Birth/Sex: Treating RN: 1965-05-21 (57 y.o. Tonita Phoenix, Lauren Primary Care Shaolin Armas: Fanny Bien Other Clinician: Referring Geanine Vandekamp: Treating Averey Trompeter/Extender: Scheryl Marten in Treatment: [place] Signs [place], Pearletha Forge ([id number]) [id number] Page 7 of 7 Time Taken: 11:20 Temperature (F): 97.7 Height (in): 66 Pulse (bpm): 94 Weight (lbs): 175 Respiratory Rate (breaths/min): 18 Body Mass Index (BMI): 28.2 Blood Pressure (mmHg): 121/75 Reference Range: 80 - 120 mg / dl Electronic Signature(s) Signed: 03/06/2022 10:40:58 AM By: Rhae Hammock RN Entered By: Rhae Hammock on 01/30/2022 11:20:57
